# Patient Record
Sex: Female | Born: 1940 | Race: White | Hispanic: No | Marital: Married | Smoking: Never smoker
Health system: Southern US, Community
[De-identification: ages and names within clinical notes are randomized; demographics above are authoritative.]

## PROBLEM LIST (undated history)

## (undated) DIAGNOSIS — E785 Hyperlipidemia, unspecified: Secondary | ICD-10-CM

## (undated) DIAGNOSIS — I10 Essential (primary) hypertension: Secondary | ICD-10-CM

## (undated) DIAGNOSIS — R9430 Abnormal result of cardiovascular function study, unspecified: Secondary | ICD-10-CM

## (undated) DIAGNOSIS — T443X5A Adverse effect of other parasympatholytics [anticholinergics and antimuscarinics] and spasmolytics, initial encounter: Secondary | ICD-10-CM

## (undated) DIAGNOSIS — Z9049 Acquired absence of other specified parts of digestive tract: Secondary | ICD-10-CM

## (undated) DIAGNOSIS — R42 Dizziness and giddiness: Secondary | ICD-10-CM

## (undated) DIAGNOSIS — IMO0002 Reserved for concepts with insufficient information to code with codable children: Secondary | ICD-10-CM

## (undated) DIAGNOSIS — R6884 Jaw pain: Secondary | ICD-10-CM

## (undated) DIAGNOSIS — R002 Palpitations: Secondary | ICD-10-CM

## (undated) DIAGNOSIS — Z789 Other specified health status: Secondary | ICD-10-CM

## (undated) DIAGNOSIS — K219 Gastro-esophageal reflux disease without esophagitis: Secondary | ICD-10-CM

## (undated) HISTORY — PX: CHOLECYSTECTOMY: SHX55

## (undated) HISTORY — DX: Dizziness and giddiness: R42

## (undated) HISTORY — DX: Reserved for concepts with insufficient information to code with codable children: IMO0002

## (undated) HISTORY — DX: Gastro-esophageal reflux disease without esophagitis: K21.9

## (undated) HISTORY — DX: Essential (primary) hypertension: I10

## (undated) HISTORY — PX: CARDIAC CATHETERIZATION: SHX172

## (undated) HISTORY — DX: Abnormal result of cardiovascular function study, unspecified: R94.30

## (undated) HISTORY — PX: OTHER SURGICAL HISTORY: SHX169

## (undated) HISTORY — DX: Hyperlipidemia, unspecified: E78.5

## (undated) HISTORY — DX: Adverse effect of other parasympatholytics (anticholinergics and antimuscarinics) and spasmolytics, initial encounter: T44.3X5A

## (undated) HISTORY — DX: Palpitations: R00.2

## (undated) HISTORY — DX: Other specified health status: Z78.9

## (undated) HISTORY — DX: Acquired absence of other specified parts of digestive tract: Z90.49

## (undated) HISTORY — DX: Jaw pain: R68.84

## (undated) HISTORY — PX: BREAST BIOPSY: SHX20

---

## 2001-05-26 ENCOUNTER — Ambulatory Visit (HOSPITAL_COMMUNITY): Admission: RE | Admit: 2001-05-26 | Discharge: 2001-05-27 | Payer: Self-pay | Admitting: Cardiology

## 2001-05-26 ENCOUNTER — Encounter: Payer: Self-pay | Admitting: Cardiology

## 2001-05-27 ENCOUNTER — Encounter: Payer: Self-pay | Admitting: Cardiology

## 2001-06-14 ENCOUNTER — Encounter: Payer: Self-pay | Admitting: Cardiology

## 2002-01-14 ENCOUNTER — Encounter: Payer: Self-pay | Admitting: Cardiology

## 2002-11-01 ENCOUNTER — Encounter: Payer: Self-pay | Admitting: Cardiology

## 2002-11-13 ENCOUNTER — Encounter: Payer: Self-pay | Admitting: Cardiology

## 2005-04-07 ENCOUNTER — Ambulatory Visit: Payer: Self-pay | Admitting: Cardiology

## 2005-04-13 ENCOUNTER — Encounter: Payer: Self-pay | Admitting: Cardiology

## 2005-04-13 ENCOUNTER — Ambulatory Visit: Payer: Self-pay | Admitting: Cardiology

## 2005-05-01 ENCOUNTER — Ambulatory Visit: Payer: Self-pay | Admitting: Cardiology

## 2006-03-19 DIAGNOSIS — Z9049 Acquired absence of other specified parts of digestive tract: Secondary | ICD-10-CM

## 2006-03-19 HISTORY — DX: Acquired absence of other specified parts of digestive tract: Z90.49

## 2008-01-25 ENCOUNTER — Encounter: Payer: Self-pay | Admitting: Cardiology

## 2008-02-24 ENCOUNTER — Encounter: Payer: Self-pay | Admitting: Cardiology

## 2008-07-11 ENCOUNTER — Encounter: Payer: Self-pay | Admitting: Cardiology

## 2009-02-28 ENCOUNTER — Encounter: Payer: Self-pay | Admitting: Cardiology

## 2009-03-04 ENCOUNTER — Encounter: Payer: Self-pay | Admitting: Cardiology

## 2009-04-05 ENCOUNTER — Encounter: Payer: Self-pay | Admitting: Cardiology

## 2009-04-30 DIAGNOSIS — R5381 Other malaise: Secondary | ICD-10-CM

## 2009-04-30 DIAGNOSIS — K219 Gastro-esophageal reflux disease without esophagitis: Secondary | ICD-10-CM

## 2009-04-30 DIAGNOSIS — R5383 Other fatigue: Secondary | ICD-10-CM

## 2009-05-01 ENCOUNTER — Ambulatory Visit: Payer: Self-pay | Admitting: Cardiology

## 2009-05-01 ENCOUNTER — Encounter: Payer: Self-pay | Admitting: Cardiology

## 2010-03-18 NOTE — Cardiovascular Report (Signed)
Summary: Cardiac Cath Other  Cardiac Cath Other   Imported By: Zachary George 04/30/2009 18:56:27  _____________________________________________________________________  External Attachment:    Type:   Image     Comment:   External Document

## 2010-03-18 NOTE — Progress Notes (Signed)
Summary: Office Visit  Office Visit   Imported By: Zachary George 04/30/2009 18:57:32  _____________________________________________________________________  External Attachment:    Type:   Image     Comment:   External Document

## 2010-03-18 NOTE — Assessment & Plan Note (Signed)
Summary: 3 YR FU R/S MAILED LETTER TO NOTIFY   Visit Type:  Follow-up Primary Provider:  Sherryll Burger  CC:  hypertension.  History of Present Illness: The patient is seen for followup of hypertension and a history of chest pain in the past..  I saw the patient last in 2007.  She had some chest discomfort in the past.  Cardiac catheterization in 2003 showed no major epicardial disease.  Her distal vessels were small.  She had a significant elevation of her LDL.  Since that time it is my understanding that the statins have been tried and that she has significant muscle weakness.  She has hypertension.  She does not tolerate medicines during the day as they make her feel tired.  She had a renal artery ultrasound done March 04, 2009.  There was no evidence of hemodynamically significant renal artery stenosis.  She has not been having any significant chest.    Preventive Screening-Counseling & Management  Alcohol-Tobacco     Smoking Status: never  Current Medications (verified): 1)  Amlodipine Besylate 10 Mg Tabs (Amlodipine Besylate) .... Take 1/2 Tablet By Mouth Once A Day 2)  Nadolol 20 Mg Tabs (Nadolol) .... Take 1 Tablet By Mouth Once A Day 3)  Clonidine Hcl 0.1 Mg Tabs (Clonidine Hcl) .... Take 1/2 Tablet By Mouth Once A Day 4)  Maxzide-25 37.5-25 Mg Tabs (Triamterene-Hctz) .... Take 1/2 Tablet By Mouth Once A Day As Needed 5)  Alprazolam 0.5 Mg Tabs (Alprazolam) .... Take 1/4 Tablet By Mouth Once A Day As Needed 6)  Prilosec 20 Mg Cpdr (Omeprazole) .... Take 1 Tablet By Mouth Once A Day  Allergies (verified): 1)  ! * Ive Dye 2)  ! * Antibiotics  Comments:  Nurse/Medical Assistant: The patient's medications and allergies were reviewed with the patient and were updated in the Medication and Allergy Lists. List reveiwed.  Past History:  Past Medical History: chest pain...cardiac catherization with no major epicardial disease but some tapering of the small distal  vasculature LDL.... elevated by history.... statin intolerance... using red yeast rice in 2011 G E R D Hypertension Jaw pain, etiology uncertain Sensitivity to atropine EF  65%... echo... April, 2003 Cholecystectomy.. February, 2010  Social History: Smoking Status:  never  Review of Systems       Patient denies fever, chills, headache, sweats, rash, change in vision, change in hearing, chest pain, cough, shortness of breath, urinary symptoms.  She does have reflux symptoms.  All of the systems are reviewed and are negative.  Vital Signs:  Patient profile:   70 year old female Height:      59 inches Weight:      157 pounds BMI:     31.82 Pulse rate:   68 / minute BP sitting:   151 / 80  (left arm) Cuff size:   large  Vitals Entered By: Carlye Grippe (May 01, 2009 1:15 PM)  Nutrition Counseling: Patient's BMI is greater than 25 and therefore counseled on weight management options. CC: hypertension   Physical Exam  General:  patient is stable today and looks well.  She is overweight. Head:  head is atraumatic. Eyes:  no xanthelasma. Neck:  no jugular venous attention. Chest Wall:  no chest wall tenderness. Lungs:  lungs are clear.  Respiratory effort is nonlabored. Heart:  cardiac exam reveals S1-S2.  No clicks or significant murmurs. Abdomen:  abdomen is soft. Msk:  no musculoskeletal deformities. Extremities:  no peripheral edema. Skin:  no skin rashes.  Psych:  patient is oriented to person time and place.  Affect is normal.   Impression & Recommendations:  Problem # 1:  CHEST PAIN (ICD-786.50) Patient has not had any significant recurrent chest pain.  No further ischemic workup is needed at this time.  EKG is done and reviewed by me.  There is normal sinus rhythm and normal EKG.  Problem # 2:  PALPITATIONS (ICD-785.1) Patient has not been having any significant palpitations.  No further workup.  Problem # 3:  PURE HYPERCHOLESTEROLEMIA (ICD-272.0) the  patient's cholesterol is being treated the best way possible.  No further evaluation now.  Problem # 4:  HYPERTENSION, BENIGN (ICD-401.1) The patient tells me that she does not tolerate her blood pressure meds well and she takes them at night.  Her systolic pressure is only mildly elevated today.  This may be the best we can do.  Her last echo was done in 2003.  There is no EKG change and no significant symptoms.  I have consider followup echo but decided that there is no definite indication at this point.  I will see her for cardiology followup over time.  Other Orders: EKG w/ Interpretation (93000)  Patient Instructions: 1)  Your physician recommends that you continue on your current medications as directed. Please refer to the Current Medication list given to you today. 2)  Your physician wants you to follow-up in: 12 months. You will receive a reminder letter in the mail about two months in advance. If you don't receive a letter, please call our office to schedule the follow-up appointment.

## 2010-03-18 NOTE — Progress Notes (Signed)
Summary: Office Visit  Office Visit   Imported By: Zachary George 04/30/2009 18:57:11  _____________________________________________________________________  External Attachment:    Type:   Image     Comment:   External Document

## 2010-03-18 NOTE — Letter (Signed)
Summary: Appointment- Rescheduled  [facility] [address]   [place], [place] [address]   Phone: [phone]  Fax: [phone]     April 05, 2009 MRN: [id number]     Lindsay Petty [address] [place], [place]  [address]     Dear [name],   Due to a change in our office schedule, your appointment on  March 9,2011                    at  1:00 pm must be changed.    Your new appointment will be March 16th at 1:00pm.  We look forward to participating in your health care needs.   Please contact us at the number listed above at your earliest convenience to reschedule this appointment if needed.      Sincerely,  Glass blower/designer

## 2010-03-18 NOTE — Letter (Signed)
Summary: Discharge Summary  Discharge Summary   Imported By: Zachary George 04/30/2009 18:56:51  _____________________________________________________________________  External Attachment:    Type:   Image     Comment:   External Document

## 2010-07-03 ENCOUNTER — Encounter: Payer: Self-pay | Admitting: Cardiology

## 2010-07-04 ENCOUNTER — Encounter: Payer: Self-pay | Admitting: Cardiology

## 2010-07-04 DIAGNOSIS — I10 Essential (primary) hypertension: Secondary | ICD-10-CM | POA: Insufficient documentation

## 2010-07-04 DIAGNOSIS — Z789 Other specified health status: Secondary | ICD-10-CM | POA: Insufficient documentation

## 2010-07-04 DIAGNOSIS — E785 Hyperlipidemia, unspecified: Secondary | ICD-10-CM | POA: Insufficient documentation

## 2010-07-04 DIAGNOSIS — R9430 Abnormal result of cardiovascular function study, unspecified: Secondary | ICD-10-CM | POA: Insufficient documentation

## 2010-07-04 DIAGNOSIS — R079 Chest pain, unspecified: Secondary | ICD-10-CM | POA: Insufficient documentation

## 2010-07-04 DIAGNOSIS — K219 Gastro-esophageal reflux disease without esophagitis: Secondary | ICD-10-CM | POA: Insufficient documentation

## 2010-07-04 DIAGNOSIS — R002 Palpitations: Secondary | ICD-10-CM | POA: Insufficient documentation

## 2010-07-04 NOTE — Discharge Summary (Signed)
[place]  Patient:    Lindsay Petty, Lindsay Petty Visit Number: [id number] MRN: [id number]          Service Type: CAT Location: 6500 [id number] 01 Attending Physician:  Ronaldo Miyamoto Dictated by:   Pennelope Bracken, N.P. Admit Date:  05/26/2001 Disc. Date: 05/27/01   CC:         Luis Abed, M.D. [facility]  Dr. Suanne Marker, [place]   Discharge Summary  REASON FOR ADMISSION:  Exertional jaw discomfort.  DISCHARGE DIAGNOSES: 1. Exertional jaw pain, etiology uncertain. 2. Hypertension. 3. History of mitral valve prolapse by echocardiogram per patient, no    documentation of this. 4. Question of hyperlipidemia. 5. Gastroesophageal reflux disease. 6. Sensitivity to atropine.  HISTORY OF PRESENT ILLNESS:  This delightful, 70 year old female was seen in the [facility] on April 3, for evaluation of exertional jaw pain.  She had initially got this on climbing stairs, but it had progressed to where it was occurring with less strenuous exercise.  She denied any rest pain, diaphoresis or nausea.  With that, she was scheduled for an endoscopy with Dr. Cleotis Nipper for evaluation of reflux, but he was hesitant to perform this without thorough cardiac evaluation.  For this reason, she was admitted for angiography.  HOSPITAL COURSE:  The patient was admitted in stable condition.  Dr. Riley Kill performed angiography on April 10, which revealed normal LV function with an EF of 61%.  There was no significant obstructive disease seen.  Distal vessels were tapered.  The patient recovered uneventfully and was returned to the floor.  She did experience some postprocedure oozing at the groin site, but this was treated and resolved quickly.  The following day, Dr. Riley Kill decided the patient was appropriate for discharge.  DISCHARGE PHYSICAL EXAMINATION:  On the day of discharge, the patient offered no complaint of jaw pain, chest pain, dyspnea or palpitations.  VITAL  SIGNS:  Blood pressure 162/70, pulse 62, respiratory rate 20, temperature 98.  Telemetry revealed normal sinus rhythm.  GENERAL:  The patient was in no acute distress.  LUNGS:  Clear to auscultation bilaterally.  CARDIAC:  Regular rate and rhythm with S1, S2 clear.  There were no murmurs, rubs or gallops.  EXTREMITIES:  Groin site without ecchymosis, hematoma or bruit.  Without clubbing, cyanosis, or edema.  LABORATORY DATA AND X-RAY FINDINGS:  A 12-lead EKG obtained the morning of discharge showed normal sinus rhythm with T wave flattening in leads I, V5 and V6.  DISPOSITION:  Discharged to home.  DISCHARGE MEDICATIONS: 1. Corgard 20 mg one q.d. 2. Prevacid 30 mg one q.d. 3. Maxzide 1/2 tablet q.d. as needed. 4. Aspirin 325 mg one q.d.  It should be noted on the patients intake sheet,    she admits to taking 1000 mg of aspirin per day.  ACTIVITY:  No driving, heavy lifting, tub baths or sexual activity x2 days.  DIET:  Low fat, low cholesterol diet.  WOUND CARE:  The patient agrees to call the office if her groin becomes hard or painful.  SPECIAL INSTRUCTIONS:  No more than one aspirin per day.  FOLLOWUP:  Followup will be with Dr. Myrtis Ser in [place] on April 24, at 11:45 a.m. as scheduled.  The patient agrees to call in the interim with any problems, questions, concerns, change or increase in symptoms. Dictated by:   Pennelope Bracken, N.P. Attending Physician:  Ronaldo Miyamoto DD:  05/27/01 TD:  05/27/01 Job: [id number]

## 2010-07-04 NOTE — Cardiovascular Report (Signed)
[place]  Patient:    Lindsay Petty, Lindsay Petty Visit Number: [id number] MRN: [id number]          Service Type: CAT Location: [facility] 2857 01 Attending Physician:  Ronaldo Miyamoto Dictated by:   Arturo Morton Riley Kill, M.D. [facility] Proc. Date: 05/26/01 Admit Date:  05/26/2001   CC:         Luis Abed, M.D. [facility] A. Cleotis Nipper, M.D., [place], [place].  Dr. Darius Bump, [place], [place].   Cardiac Catheterization  INDICATIONS:  Ms. Qualley is a very pleasant 70 year old white female who is referred for cardiac catheterization.  She saw Dr. Jerral Bonito in the office in [place] and provided a history of exertion related jaw discomfort.  She was referred by Dr. Cleotis Nipper because of symptoms that sounded very compatible with angina.  Risks, benefits, and alternatives were discussed with the patient and her husband.  She was brought to the lab for further evaluation.  PROCEDURES: 1. Selective left ventriculography. 2. Selective coronary arteriography. 3. Left heart catheterization.  DESCRIPTION OF PROCEDURE:  The procedure was performed from the right femoral artery through an anterior puncture.  The #6 French catheters were utilized. She experienced no complications.  Her blood pressure was elevated and she was given two doses of intravenous metoprolol, 5 mg.  She was taken to the holding area in satisfactory clinical condition.  ANGIOGRAPHIC DATA: 1. Ventriculography was performed in the RAO projection.  Overall left    ventricular function was preserved and no segmental abnormalities,    contraction were identified.  Ejection fraction was calculated at 61%.  I    could not appreciate significant mitral regurgitation.  2. The left main coronary artery appeared free of critical disease.  3. The left anterior descending artery coursed to the apex.  There were four    small diagonal branches.  The LAD as well as the other terminal branches of    the coronary  artery all tapered rather quickly and were fairly small in    caliber.  4. There is a small ramus that was free of critical disease.  5. There was a circumflex that provided two marginals that appeared to be free    of significant disease.  Again, the vessels terminated distally as small    vessels.  6. The right coronary artery was a dominant vessel.  There was a small acute    marginal branch in terms of caliber.  There was a moderate sized    posterolateral branch.  All of these were without obvious focal narrowing.  CONCLUSIONS: 1. Preserved left ventricular function. 2. 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  DISPOSITION:  The patient will follow up with Dr. Jerral Bonito in the [place] office. Dictated by:   Arturo Morton Riley Kill, M.D. [facility] Attending Physician:  Ronaldo Miyamoto DD:  05/26/01 TD:  05/26/01 Job: [id number]

## 2010-07-07 ENCOUNTER — Encounter: Payer: Self-pay | Admitting: Cardiology

## 2010-07-07 ENCOUNTER — Ambulatory Visit (INDEPENDENT_AMBULATORY_CARE_PROVIDER_SITE_OTHER): Payer: Medicare Other | Admitting: Cardiology

## 2010-07-07 DIAGNOSIS — I10 Essential (primary) hypertension: Secondary | ICD-10-CM

## 2010-07-07 DIAGNOSIS — R079 Chest pain, unspecified: Secondary | ICD-10-CM

## 2010-07-07 DIAGNOSIS — R002 Palpitations: Secondary | ICD-10-CM

## 2010-07-07 DIAGNOSIS — Z888 Allergy status to other drugs, medicaments and biological substances status: Secondary | ICD-10-CM

## 2010-07-07 DIAGNOSIS — Z789 Other specified health status: Secondary | ICD-10-CM

## 2010-07-07 MED ORDER — AMLODIPINE BESYLATE 10 MG PO TABS: 10.0000 mg | ORAL_TABLET | Freq: Every day | ORAL | Status: DC

## 2010-07-07 NOTE — Assessment & Plan Note (Signed)
I feel that the patient's palpitations are not significant.  Further testing is not need.

## 2010-07-07 NOTE — Assessment & Plan Note (Signed)
Blood pressure is elevated today.  She has had excess salt intake.  I discussed this with her.  I will also increase her amlodipine to 10 mg daily.  I will then see her back for followup.

## 2010-07-07 NOTE — Assessment & Plan Note (Signed)
She has not had any significant chest pain.  No further workup is needed. 

## 2010-07-07 NOTE — Patient Instructions (Signed)
   Increase Amlodipine to 10mg  daily Your physician recommends that you go to the [facility] for lab work for [employer] level. If the results of your test are normal or stable, you will receive a letter.  If they are abnormal, the nurse will contact you by phone. Follow up in  6 weeks - see above for appointment.

## 2010-07-07 NOTE — Assessment & Plan Note (Signed)
In the past the patient's LDL has been elevated.  She had used red yeast rice in the past.  She is not currently on this.  Unfortunately she is intolerant to statins.

## 2010-07-07 NOTE — Progress Notes (Signed)
HPI Lindsay Petty is seen for followup of palpitations and hypertension.  I saw her last March, 2011.  In general she's done well.  She's noted some palpitations.  She feels better with a small dose of over-the-counter magnesium and potassium.  We will check her chemistry values to be sure that these are not excessive.  She has some mild positional vertigo.  In the in the past she had some jaw discomfort.  This has resolved.  As part of today's evaluation I have reviewed her old records and brought the current EMR up-to-date. Allergies  Allergen Reactions  . Band-Aid Plus Antibiotic (Bacitracin-Polymyxin B) Rash  . Latex Rash    Current Outpatient Prescriptions  Medication Sig Dispense Refill  . amLODipine (NORVASC) 5 MG tablet Take 5 mg by mouth daily.        . calcium carbonate (OS-CAL) 600 MG TABS Take 600 mg by mouth 2 (two) times daily with a meal.        . Cholecalciferol (VITAMIN D3) 1000 UNITS CAPS Take 1 capsule by mouth daily.        . Coenzyme Q10 (COQ10) 100 MG CAPS Take 1 capsule by mouth daily.        . Flaxseed, Linseed, (FLAXSEED OIL) 1000 MG CAPS Take 1 capsule by mouth 2 (two) times daily.        [name] glucosamine-chondroitin 500-400 MG tablet Take 1 tablet by mouth daily as needed.        . Mag Aspart-Potassium Aspart (POTASSIUM & MAGNESIUM ASPARTAT PO) Take 1 capsule by mouth daily.        . Multiple Vitamin (MULTIVITAMIN) tablet Take 1 tablet by mouth daily.        . nadolol (CORGARD) 20 MG tablet Take 20 mg by mouth daily.        [name] omeprazole (PRILOSEC) 20 MG capsule Take 20 mg by mouth daily.        [name] DISCONTD: ALPRAZolam (XANAX) 0.5 MG tablet Take 0.25 mg by mouth at bedtime as needed.        [name] DISCONTD: AMLODIPINE BESYLATE PO Take 10 mg by mouth daily.       [name] DISCONTD: CLONIDINE HCL PO Take 0.1 mg by mouth daily. Take 1/2 tab by mouth PRN.       . DISCONTD: triamterene-hydrochlorothiazide (MAXZIDE-25) 37.5-25 MG per tablet Take 1 tablet by mouth daily.          History    Social History  . Marital Status: Single    Spouse Name: N/A    Number of Children: N/A  . Years of Education: N/A   Occupational History  . retired    Social History Main Topics  . Smoking status: Never Smoker   . Smokeless tobacco: Not on file  . Alcohol Use: No  . Drug Use: No  . Sexually Active: Not on file   Other Topics Concern  . Not on file   Social History Narrative  . No narrative on file    Family History  Problem Relation Age of Onset  . Coronary artery disease      unknown    Past Medical History  Diagnosis Date  . Chest pain     cardiac catheterization 2003, no major epicardial disease, small distal vessels I  . GERD (gastroesophageal reflux disease)   . Hyperlipemia   . Hypertension     renal artery ultrasound, January, 2011, no significant renal artery stenosis,  ... medications make her feel tired  .  Jaw pain     etiology uncertain  . Ejection fraction     65%.Marland Kitchenecho April 2003  . Hx of cholecystectomy 03/2006    lap   . Statin intolerance     muscle weakness  . Atropine adverse reaction     atropine and sensitivity by history  . Palpitations     No past surgical history on file.  ROS  Lindsay Petty denies fever, chills, headache, sweats, rash, change in vision, change in hearing, chest pain, cough, nausea vomiting, urinary symptoms.  All other systems are reviewed and are negative.  PHYSICAL EXAM Lindsay Petty is oriented to person time and place.  Affect is normal.  Head is atraumatic.  There is no xanthelasma.  There is no jugular venous distention.  Lungs are clear.  Respiratory effort is nonlabored.  Cardiac exam reveals S1 and S2.  There are no clicks or significant murmurs.  The abdomen is soft.  There is no peripheral edema.  No musculoskeletal deformities.  There are no skin rashes. Filed Vitals:   07/07/10 0950 07/07/10 0952  BP: 175/88 171/76  Pulse: 70 69  Height: 5' (1.524 m)   Weight: 157 lb (71.215 kg)     EKG EKG is done today  and reviewed by me.  EKG is normal.  ASSESSMENT & PLAN

## 2010-07-29 ENCOUNTER — Encounter: Payer: Self-pay | Admitting: Cardiology

## 2010-08-19 ENCOUNTER — Encounter: Payer: Self-pay | Admitting: Cardiology

## 2010-08-19 ENCOUNTER — Ambulatory Visit (INDEPENDENT_AMBULATORY_CARE_PROVIDER_SITE_OTHER): Payer: Medicare Other | Admitting: Cardiology

## 2010-08-19 DIAGNOSIS — R42 Dizziness and giddiness: Secondary | ICD-10-CM

## 2010-08-19 DIAGNOSIS — I10 Essential (primary) hypertension: Secondary | ICD-10-CM

## 2010-08-19 DIAGNOSIS — R002 Palpitations: Secondary | ICD-10-CM

## 2010-08-19 DIAGNOSIS — R079 Chest pain, unspecified: Secondary | ICD-10-CM

## 2010-08-19 MED ORDER — HYDROCHLOROTHIAZIDE 12.5 MG PO CAPS: 12.5000 mg | ORAL_CAPSULE | Freq: Every day | ORAL | Status: DC

## 2010-08-19 MED ORDER — NADOLOL 20 MG PO TABS: 20.0000 mg | ORAL_TABLET | Freq: Every day | ORAL | Status: DC

## 2010-08-19 MED ORDER — AMLODIPINE BESYLATE 10 MG PO TABS: 10.0000 mg | ORAL_TABLET | Freq: Every day | ORAL | Status: DC

## 2010-08-19 NOTE — Progress Notes (Signed)
HPI   Patient is seen for followup of hypertension, chest pain, palpitations.  She is not having any significant palpitations or chest pain.  Her diastolic blood pressures controlled.  We did increase her amlodipine.  Despite this she still has some intermittent systolic hypertension.  When she uses a small dose of Xanax her blood pressure is better.  However she knows we cannot use his medications blood pressure medicine.  With the amlodipine she's had slight increase in edema.  This does not concern her and I explained that it is a common side effect. Allergies  Allergen Reactions  . Band-Aid Plus Antibiotic (Bacitracin-Polymyxin B) Rash  . Latex Rash    Current Outpatient Prescriptions  Medication Sig Dispense Refill  . amLODipine (NORVASC) 10 MG tablet Take 1 tablet (10 mg total) by mouth daily.  30 tablet  6  . calcium carbonate (OS-CAL) 600 MG TABS Take 600 mg by mouth 2 (two) times daily with a meal.        . Cholecalciferol (VITAMIN D3) 1000 UNITS CAPS Take 1 capsule by mouth daily.        . Coenzyme Q10 (COQ10) 100 MG CAPS Take 1 capsule by mouth daily.        . Flaxseed, Linseed, (FLAXSEED OIL) 1000 MG CAPS Take 1 capsule by mouth 2 (two) times daily.        [name] glucosamine-chondroitin 500-400 MG tablet Take 1 tablet by mouth daily as needed.        . Mag Aspart-Potassium Aspart (POTASSIUM & MAGNESIUM ASPARTAT PO) Take 1 capsule by mouth daily.        . Multiple Vitamin (MULTIVITAMIN) tablet Take 1 tablet by mouth daily.        . nadolol (CORGARD) 20 MG tablet Take 20 mg by mouth daily.        [name] omeprazole (PRILOSEC) 20 MG capsule Take 20 mg by mouth daily.          History   Social History  . Marital Status: Single    Spouse Name: N/A    Number of Children: N/A  . Years of Education: N/A   Occupational History  . retired    Social History Main Topics  . Smoking status: Never Smoker   . Smokeless tobacco: Never Used  . Alcohol Use: No  . Drug Use: No  . Sexually Active:  Not on file   Other Topics Concern  . Not on file   Social History Narrative   Retired, married.     Family History  Problem Relation Age of Onset  . Coronary artery disease      unknown    Past Medical History  Diagnosis Date  . Chest pain     cardiac catheterization 2003, no major epicardial disease, small distal vessels I  . GERD (gastroesophageal reflux disease)   . Hyperlipemia   . Hypertension     renal artery ultrasound, January, 2011, no significant renal artery stenosis,  ... medications make her feel tired  . Jaw pain     etiology uncertain  . Ejection fraction     65%.Marland Kitchenecho April 2003  . Hx of cholecystectomy 03/2006    lap   . Statin intolerance     muscle weakness  . Atropine adverse reaction     atropine and sensitivity by history  . Palpitations     Past Surgical History  Procedure Date  . Cardiac catheterization   . Breast biopsy     x2 for  benign disease  . Rt knee arthroscopy   . Cholecystectomy     (lap) 2008.    ROS  Patient denies fever, chills, headache, sweats, rash, change in vision, change in hearing, chest pain, cough, nausea vomiting, urinary symptoms.  All other systems are reviewed and negative.  PHYSICAL EXAM Patient is stable today.  She is oriented to person time and place.  Affect is normal.  Head is atraumatic.  Lungs are clear.  Respiratory effort is unlabored.  Cardiac exam feels muffled S2.  No clicks or significant murmurs.  He is soft.  There is no significant peripheral edema at this time. Filed Vitals:   08/19/10 0938  BP: 164/85  Pulse: 64  Height: 5' (1.524 m)  Weight: 158 lb (71.668 kg)    EKG Is not done today.  ASSESSMENT & PLAN

## 2010-08-19 NOTE — Assessment & Plan Note (Signed)
Very mild palpitations.  No change in therapy.

## 2010-08-19 NOTE — Assessment & Plan Note (Signed)
This is mild at times with turning her head.  His is not a formal orthostasis for her.

## 2010-08-19 NOTE — Assessment & Plan Note (Signed)
Blood pressure is still higher than I would like.  The next step will be to add a low dose of hydrochlorothiazide.  Hold and see her for followup.

## 2010-08-19 NOTE — Patient Instructions (Addendum)
Begin HCTZ 12.5mg  daily Follow up - see appointment above.

## 2010-08-19 NOTE — Assessment & Plan Note (Signed)
No significant chest pain.  No further workup.

## 2010-10-08 ENCOUNTER — Ambulatory Visit (INDEPENDENT_AMBULATORY_CARE_PROVIDER_SITE_OTHER): Payer: Medicare Other | Admitting: Cardiology

## 2010-10-08 ENCOUNTER — Encounter: Payer: Self-pay | Admitting: Cardiology

## 2010-10-08 DIAGNOSIS — R079 Chest pain, unspecified: Secondary | ICD-10-CM

## 2010-10-08 DIAGNOSIS — I10 Essential (primary) hypertension: Secondary | ICD-10-CM

## 2010-10-08 DIAGNOSIS — Z79899 Other long term (current) drug therapy: Secondary | ICD-10-CM

## 2010-10-08 NOTE — Assessment & Plan Note (Signed)
Blood pressure is under good control.  No change in therapy.  She's had a small dose of a diuretic added.  She will need chemistry checked to be sure that her potassium is stable.

## 2010-10-08 NOTE — Assessment & Plan Note (Signed)
She's had no recurrent chest pain. No further workup. 

## 2010-10-08 NOTE — Patient Instructions (Signed)
Your physician wants you to follow-up in: 6 months. You will receive a reminder letter in the mail one-two months in advance. If you don't receive a letter, please call our office to schedule the follow-up appointment. Your physician recommends that you continue on your current medications as directed. Please refer to the Current Medication list given to you today. Your physician recommends that you go to the [facility] for lab work: BMET If the results of your test are normal or stable, you will receive a letter. If they are abnormal, the nurse will contact you by phone.

## 2010-10-08 NOTE — Progress Notes (Signed)
HPI Patient is seen for followup of hypertension and fluid overload.  We had a small dose of hydrochlorothiazide at the time of her last visit.  She has done very well with this.  Her edema is very minimal.  She is not having any significant dizziness.  She's not having any chest pain. Allergies  Allergen Reactions  . Band-Aid Plus Antibiotic (Bacitracin-Polymyxin B) Rash  . Latex Rash    Current Outpatient Prescriptions  Medication Sig Dispense Refill  . amLODipine (NORVASC) 10 MG tablet Take 1 tablet (10 mg total) by mouth daily.  100 tablet  3  . calcium carbonate (OS-CAL) 600 MG TABS Take 600 mg by mouth 2 (two) times daily with a meal.        . Cholecalciferol (VITAMIN D3) 1000 UNITS CAPS Take 1 capsule by mouth daily.        . Coenzyme Q10 (COQ10) 100 MG CAPS Take 1 capsule by mouth daily.        . Flaxseed, Linseed, (FLAXSEED OIL) 1000 MG CAPS Take 1 capsule by mouth 2 (two) times daily.        [name] glucosamine-chondroitin 500-400 MG tablet Take 1 tablet by mouth daily as needed.        . hydrochlorothiazide (,MICROZIDE/HYDRODIURIL,) 12.5 MG capsule Take 1 capsule (12.5 mg total) by mouth daily.  30 capsule  6  . Mag Aspart-Potassium Aspart (POTASSIUM & MAGNESIUM ASPARTAT PO) Take 1 capsule by mouth daily.        . Multiple Vitamin (MULTIVITAMIN) tablet Take 1 tablet by mouth daily.        . nadolol (CORGARD) 20 MG tablet Take 1 tablet (20 mg total) by mouth daily.  90 tablet  3  . omeprazole (PRILOSEC) 20 MG capsule Take 20 mg by mouth daily.          History   Social History  . Marital Status: Single    Spouse Name: N/A    Number of Children: N/A  . Years of Education: N/A   Occupational History  . retired    Social History Main Topics  . Smoking status: Never Smoker   . Smokeless tobacco: Never Used  . Alcohol Use: No  . Drug Use: No  . Sexually Active: Not on file   Other Topics Concern  . Not on file   Social History Narrative   Retired, married.     Family  History  Problem Relation Age of Onset  . Coronary artery disease      unknown    Past Medical History  Diagnosis Date  . Chest pain     cardiac catheterization 2003, no major epicardial disease, small distal vessels I  . GERD (gastroesophageal reflux disease)   . Hyperlipemia   . Hypertension     renal artery ultrasound, January, 2011, no significant renal artery stenosis,  ... medications make her feel tired  . Jaw pain     etiology uncertain  . Ejection fraction     65%.Marland Kitchenecho April 2003  . Hx of cholecystectomy 03/2006    lap   . Statin intolerance     muscle weakness  . Atropine adverse reaction     atropine and sensitivity by history  . Palpitations   . Vertigo     Mild positional vertigo    Past Surgical History  Procedure Date  . Cardiac catheterization   . Breast biopsy     x2 for benign disease  . Rt knee arthroscopy   .  Cholecystectomy     (lap) 2008.    ROS  Patient denies fever, chills, headache, sweats, rash, change in vision, change in hearing, chest pain cough, nausea vomiting, urinary symptoms.  All other systems are reviewed and are negative.  PHYSICAL EXAM Patient looks quite good today.  She is oriented to person time and place.  Affect is normal.  Head is atraumatic.  Lungs are clear.  Respiratory effort is unlabored.  Cardiac exam reveals S1 and S2.  No clicks or significant murmurs.  Abdomen is soft.  No peripheral edema. Filed Vitals:   10/08/10 1107  BP: 150/82  Pulse: 65  Height: 5\' 3"  (1.6 m)  Weight: 158 lb (71.668 kg)    EKG is not done today.  ASSESSMENT & PLAN

## 2011-04-07 ENCOUNTER — Other Ambulatory Visit: Payer: Self-pay | Admitting: Cardiology

## 2011-08-04 ENCOUNTER — Other Ambulatory Visit: Payer: Self-pay | Admitting: Cardiology

## 2011-10-13 ENCOUNTER — Other Ambulatory Visit: Payer: Self-pay | Admitting: Cardiology

## 2011-11-17 ENCOUNTER — Other Ambulatory Visit: Payer: Self-pay | Admitting: Cardiology

## 2011-11-26 ENCOUNTER — Other Ambulatory Visit: Payer: Self-pay | Admitting: Cardiology

## 2011-12-07 ENCOUNTER — Ambulatory Visit: Payer: Medicare Other | Admitting: Cardiology

## 2011-12-11 ENCOUNTER — Other Ambulatory Visit: Payer: Self-pay | Admitting: Cardiology

## 2011-12-22 ENCOUNTER — Other Ambulatory Visit: Payer: Self-pay | Admitting: Cardiology

## 2011-12-23 ENCOUNTER — Other Ambulatory Visit: Payer: Self-pay | Admitting: Cardiology

## 2012-01-01 ENCOUNTER — Encounter: Payer: Self-pay | Admitting: Cardiology

## 2012-01-01 ENCOUNTER — Ambulatory Visit (INDEPENDENT_AMBULATORY_CARE_PROVIDER_SITE_OTHER): Payer: Medicare Other | Admitting: Cardiology

## 2012-01-01 VITALS — BP 148/79 | HR 69 | Ht 60.0 in | Wt 157.0 lb

## 2012-01-01 DIAGNOSIS — R079 Chest pain, unspecified: Secondary | ICD-10-CM

## 2012-01-01 DIAGNOSIS — I10 Essential (primary) hypertension: Secondary | ICD-10-CM

## 2012-01-01 DIAGNOSIS — R002 Palpitations: Secondary | ICD-10-CM

## 2012-01-01 MED ORDER — AMLODIPINE BESYLATE 10 MG PO TABS: 10.0000 mg | ORAL_TABLET | Freq: Every day | ORAL | Status: DC

## 2012-01-01 MED ORDER — NADOLOL 40 MG PO TABS: 20.0000 mg | ORAL_TABLET | Freq: Every day | ORAL | Status: DC

## 2012-01-01 MED ORDER — HYDROCHLOROTHIAZIDE 12.5 MG PO CAPS: 12.5000 mg | ORAL_CAPSULE | Freq: Every day | ORAL | Status: DC

## 2012-01-01 NOTE — Patient Instructions (Addendum)
Your physician recommends that you schedule a follow-up appointment in: 1 year. You will receive a reminder letter in the mail in about 10 months reminding you to call and schedule your appointment. If you don't receive this letter, please contact our office.  Your physician has recommended you make the following change in your medication: Please take amlodipine 10 mg instead of 5 mg as listed on your list today. All other medications will remain the same.

## 2012-01-01 NOTE — Assessment & Plan Note (Signed)
The patient had been on 10 mg of Norvasc with good control. She cut this back when she was standing a great deal and had some swelling. She is now able to get her feet elevated more the time. Her systolic pressure is mildly elevated on 5 mg. She has agreed to return to 10 mg and see how she does. If she has significant edema we should add a different medication.

## 2012-01-01 NOTE — Assessment & Plan Note (Signed)
The patient is not having any significant chest pain. No further workup.

## 2012-01-01 NOTE — Progress Notes (Signed)
Patient ID: Lindsay Petty, female   DOB: 1940-07-24, 71 y.o.   MRN: [id number]   HPI The patient is seen today to followup hypertension and fluid overload. I saw her last August, 2012. She really looks good. Over the past several months she has been very active and on her feet a great deal arranging for a wedding and remodeling her house. During this period she noticed some increased swelling in her feet. She felt this was from her Norvasc. She reduced the dose to 5 mg daily. She's not having any chest pain or dizziness  Allergies  Allergen Reactions  . Band-Aid Plus Antibiotic (Bacitracin-Polymyxin B) Rash  . Latex Rash    Current Outpatient Prescriptions  Medication Sig Dispense Refill  . amLODipine (NORVASC) 10 MG tablet TAKE 1 TABLET BY MOUTH DAILY  30 tablet  6  . calcium carbonate (OS-CAL) 600 MG TABS Take 600 mg by mouth daily.       . Cholecalciferol (VITAMIN D3) 1000 UNITS CAPS Take 1 capsule by mouth daily.        . Coenzyme Q10 (COQ10) 100 MG CAPS Take 1 capsule by mouth daily.        . Flaxseed, Linseed, (FLAXSEED OIL) 1000 MG CAPS Take 1 capsule by mouth daily.       [name] glucosamine-chondroitin 500-400 MG tablet Take 1 tablet by mouth daily as needed.        . hydrochlorothiazide (MICROZIDE) 12.5 MG capsule TAKE ONE CAPSULE BY MOUTH EVERY DAY  25 capsule  0  . Mag Aspart-Potassium Aspart (POTASSIUM & MAGNESIUM ASPARTAT PO) Take 1 capsule by mouth daily.        . Multiple Vitamin (MULTIVITAMIN) tablet Take 1 tablet by mouth daily.        . nadolol (CORGARD) 40 MG tablet TAKE ONE-HALF TABLET BY MOUTH EVERY DAY  15 tablet  0  . omeprazole (PRILOSEC) 20 MG capsule Take 20 mg by mouth daily.        . [DISCONTINUED] nadolol (CORGARD) 20 MG tablet Take 1 tablet (20 mg total) by mouth daily.  90 tablet  3    History   Social History  . Marital Status: Single    Spouse Name: N/A    Number of Children: N/A  . Years of Education: N/A   Occupational History  . retired     Social History Main Topics  . Smoking status: Never Smoker   . Smokeless tobacco: Never Used  . Alcohol Use: No  . Drug Use: No  . Sexually Active: Not on file   Other Topics Concern  . Not on file   Social History Narrative   Retired, married.     Family History  Problem Relation Age of Onset  . Coronary artery disease      unknown    Past Medical History  Diagnosis Date  . Chest pain     cardiac catheterization 2003, no major epicardial disease, small distal vessels I  . GERD (gastroesophageal reflux disease)   . Hyperlipemia   . Hypertension     renal artery ultrasound, January, 2011, no significant renal artery stenosis,  ... medications make her feel tired  . Jaw pain     etiology uncertain  . Ejection fraction     65%.Marland Kitchenecho April 2003  . Hx of cholecystectomy 03/2006    lap   . Statin intolerance     muscle weakness  . Atropine adverse reaction     atropine and sensitivity by  history  . Palpitations   . Vertigo     Mild positional vertigo    Past Surgical History  Procedure Date  . Cardiac catheterization   . Breast biopsy     x2 for benign disease  . Rt knee arthroscopy   . Cholecystectomy     (lap) 2008.    Patient Active Problem List  Diagnosis  . ESOPHAGEAL REFLUX  . OTHER MALAISE AND FATIGUE  . Chest pain  . GERD (gastroesophageal reflux disease)  . Hyperlipemia  . Hypertension  . Ejection fraction  . Statin intolerance  . Palpitations  . Vertigo    ROS   Patient denies fever, chills, headache, sweats, rash, change in vision, change in hearing, chest pain, cough, nausea vomiting, urinary symptoms. All the systems are reviewed and are negative.  PHYSICAL EXAM  Patient is oriented to person time and place. Affect is normal. Lungs are clear. Respiratory effort is nonlabored. There is no jugular venous distention. Cardiac exam reveals S1 and S2. There no clicks or significant murmurs. The abdomen is soft. There is no peripheral  edema.  Filed Vitals:   01/01/12 1056  BP: 148/79  Pulse: 69  Height: 5' (1.524 m)  Weight: 157 lb (71.215 kg)   EKG is done today and reviewed by me. The EKG is normal. There is slight decreased anterior R wave progression. There is no significant change.  ASSESSMENT & PLAN

## 2012-01-01 NOTE — Assessment & Plan Note (Signed)
She's not having any significant palpitations. See her back in one year.

## 2013-01-02 ENCOUNTER — Encounter: Payer: Self-pay | Admitting: Cardiology

## 2013-01-02 ENCOUNTER — Ambulatory Visit (INDEPENDENT_AMBULATORY_CARE_PROVIDER_SITE_OTHER): Payer: Medicare Other | Admitting: Cardiology

## 2013-01-02 VITALS — BP 167/88 | HR 68 | Ht 60.0 in | Wt 163.1 lb

## 2013-01-02 DIAGNOSIS — I10 Essential (primary) hypertension: Secondary | ICD-10-CM

## 2013-01-02 DIAGNOSIS — R079 Chest pain, unspecified: Secondary | ICD-10-CM

## 2013-01-02 DIAGNOSIS — E785 Hyperlipidemia, unspecified: Secondary | ICD-10-CM

## 2013-01-02 DIAGNOSIS — R002 Palpitations: Secondary | ICD-10-CM

## 2013-01-02 MED ORDER — NADOLOL 40 MG PO TABS: 20.0000 mg | ORAL_TABLET | Freq: Every day | ORAL | Status: DC

## 2013-01-02 MED ORDER — AMLODIPINE BESYLATE 10 MG PO TABS: 10.0000 mg | ORAL_TABLET | Freq: Every day | ORAL | Status: DC

## 2013-01-02 MED ORDER — HYDROCHLOROTHIAZIDE 12.5 MG PO CAPS: 12.5000 mg | ORAL_CAPSULE | Freq: Every day | ORAL | Status: DC

## 2013-01-02 NOTE — Progress Notes (Signed)
HPI Patient is seen back today to followup a history of mild chest discomfort, also some hypertension, also palpitations. I saw her last November, 2013. She has been stable since then. She has mild palpitations. She does not have syncope or presyncope. I was able to get her back on a higher dose of amlodipine. She is taking 10 mg daily and not having significant swelling. She has not checked her blood pressure over time as she loaned her blood pressure monitor to her son. She's not having any significant chest pain.  Allergies  Allergen Reactions  . Bee Venom Anaphylaxis  . Atropine     Heart race   . Band-Aid Plus Antibiotic [Bacitracin-Polymyxin B] Rash  . Latex Rash    Current Outpatient Prescriptions  Medication Sig Dispense Refill  . amLODipine (NORVASC) 10 MG tablet Take 1 tablet (10 mg total) by mouth daily.  30 tablet  6  . aspirin 81 MG EC tablet Take 81 mg by mouth daily. Swallow whole.      . calcium carbonate (OS-CAL) 600 MG TABS Take 600 mg by mouth daily.       . Cholecalciferol (VITAMIN D3) 1000 UNITS CAPS Take 1 capsule by mouth daily.        . Coenzyme Q10 (COQ10) 100 MG CAPS Take 1 capsule by mouth daily.        [name] esomeprazole (NEXIUM) 20 MG capsule Take 20 mg by mouth daily at 12 noon.      . Flaxseed, Linseed, (FLAXSEED OIL) 1000 MG CAPS Take 1 capsule by mouth daily.       [name] glucosamine-chondroitin 500-400 MG tablet Take 1 tablet by mouth daily as needed.        . hydrochlorothiazide (MICROZIDE) 12.5 MG capsule Take 1 capsule (12.5 mg total) by mouth daily.  90 capsule  3  . Mag Aspart-Potassium Aspart (POTASSIUM & MAGNESIUM ASPARTAT PO) Take 1 capsule by mouth daily.        . Multiple Vitamin (MULTIVITAMIN) tablet Take 1 tablet by mouth daily.        . nadolol (CORGARD) 40 MG tablet Take 0.5 tablets (20 mg total) by mouth daily.  45 tablet  3   No current facility-administered medications for this visit.    History   Social History  . Marital Status: Single     Spouse Name: N/A    Number of Children: N/A  . Years of Education: N/A   Occupational History  . retired    Social History Main Topics  . Smoking status: Never Smoker   . Smokeless tobacco: Never Used  . Alcohol Use: No  . Drug Use: No  . Sexual Activity: Not on file   Other Topics Concern  . Not on file   Social History Narrative   Retired, married.     Family History  Problem Relation Age of Onset  . Coronary artery disease      unknown    Past Medical History  Diagnosis Date  . Chest pain     cardiac catheterization 2003, no major epicardial disease, small distal vessels I  . GERD (gastroesophageal reflux disease)   . Hyperlipemia   . Hypertension     renal artery ultrasound, January, 2011, no significant renal artery stenosis,  ... medications make her feel tired  . Jaw pain     etiology uncertain  . Ejection fraction     65%.Marland Kitchenecho April 2003  . Hx of cholecystectomy 03/2006    lap   .  Statin intolerance     muscle weakness  . Atropine adverse reaction     atropine and sensitivity by history  . Palpitations   . Vertigo     Mild positional vertigo    Past Surgical History  Procedure Laterality Date  . Cardiac catheterization    . Breast biopsy      x2 for benign disease  . Rt knee arthroscopy    . Cholecystectomy      (lap) 2008.    Patient Active Problem List   Diagnosis Date Noted  . Vertigo   . Chest pain   . GERD (gastroesophageal reflux disease)   . Hyperlipemia   . Hypertension   . Ejection fraction   . Statin intolerance   . Palpitations   . ESOPHAGEAL REFLUX 04/30/2009  . OTHER MALAISE AND FATIGUE 04/30/2009    ROS  Patient denies fever, chills, headache, sweats, rash, change in vision, change in hearing, chest pain, cough, nausea vomiting, urinary symptoms. All other systems are reviewed and are negative.  PHYSICAL EXAM Patient is overweight. She is oriented to person time and place. Affect is normal. There is no  jugulovenous distention. Lungs are clear. Respiratory effort is nonlabored. Cardiac exam reveals S1 and S2. There no clicks or significant murmurs. Abdomen is soft. There is no peripheral edema. There are no musculoskeletal deformities. There are no skin rashes.  Filed Vitals:   01/02/13 1009  BP: 167/88  Pulse: 68  Height: 5' (1.524 m)  Weight: 163 lb 1.9 oz (73.991 kg)  SpO2: 97%   EKG is done today and reviewed by me. There is sinus rhythm. There is no significant change from the past.  ASSESSMENT & PLAN

## 2013-01-02 NOTE — Assessment & Plan Note (Signed)
Patient's systolic blood pressure is elevated today. I've chosen not to change her medicine today. We know from the past that she does not have renal artery stenosis. She will take her blood pressure to home on several occasions and call those values to [place]. We will then decide if further treatment is needed.

## 2013-01-02 NOTE — Assessment & Plan Note (Signed)
Historically she does not have significant arrhythmias. No further workup.

## 2013-01-02 NOTE — Assessment & Plan Note (Signed)
Patient is not having any significant pain. Catheterization in 2003 revealed no major epicardial disease. She did have small distal vessels.

## 2013-01-02 NOTE — Assessment & Plan Note (Signed)
Patient has had statin intolerance. No change in therapy.

## 2013-01-02 NOTE — Patient Instructions (Signed)
Your physician recommends that you schedule a follow-up appointment in: 1 year. You will receive a reminder letter in the mail in about 10 months reminding you to call and schedule your appointment. If you don't receive this letter, please contact our office. Your physician recommends that you continue on your current medications as directed. Please refer to the Current Medication list given to you today. Your physician has requested that you regularly monitor and record your blood pressure readings at home. Please use the same machine at the same time of day to check your readings and record them. Please call results to [place] or bring them to the office.

## 2013-02-01 ENCOUNTER — Encounter: Payer: Self-pay | Admitting: Cardiology

## 2013-09-18 ENCOUNTER — Encounter (INDEPENDENT_AMBULATORY_CARE_PROVIDER_SITE_OTHER): Payer: Self-pay | Admitting: *Deleted

## 2013-10-17 ENCOUNTER — Telehealth (INDEPENDENT_AMBULATORY_CARE_PROVIDER_SITE_OTHER): Payer: Self-pay | Admitting: *Deleted

## 2013-10-17 ENCOUNTER — Encounter (INDEPENDENT_AMBULATORY_CARE_PROVIDER_SITE_OTHER): Payer: Self-pay | Admitting: Internal Medicine

## 2013-10-17 ENCOUNTER — Other Ambulatory Visit (INDEPENDENT_AMBULATORY_CARE_PROVIDER_SITE_OTHER): Payer: Self-pay | Admitting: *Deleted

## 2013-10-17 ENCOUNTER — Ambulatory Visit (INDEPENDENT_AMBULATORY_CARE_PROVIDER_SITE_OTHER): Payer: Medicare Other | Admitting: Internal Medicine

## 2013-10-17 VITALS — BP 124/62 | HR 65 | Temp 97.8°F | Ht 60.0 in | Wt 159.5 lb

## 2013-10-17 DIAGNOSIS — R195 Other fecal abnormalities: Secondary | ICD-10-CM | POA: Insufficient documentation

## 2013-10-17 DIAGNOSIS — Z860100 Personal history of colon polyps, unspecified: Secondary | ICD-10-CM | POA: Insufficient documentation

## 2013-10-17 DIAGNOSIS — Z1211 Encounter for screening for malignant neoplasm of colon: Secondary | ICD-10-CM

## 2013-10-17 DIAGNOSIS — Z800 Family history of malignant neoplasm of digestive organs: Secondary | ICD-10-CM | POA: Insufficient documentation

## 2013-10-17 MED ORDER — PEG-KCL-NACL-NASULF-NA ASC-C 100 G PO SOLR: 1.0000 | Freq: Once | ORAL | Status: DC

## 2013-10-17 NOTE — Progress Notes (Addendum)
Subjective:    Patient ID: Lindsay Petty, female    DOB: 08-26-1940, 73 y.o.   MRN: [id number]  HPIReferred to our office by Dr. Manuella Ghazi for positive stool card. In his office for a routine physical.  She was taking ASA 81 mg at that time. She says she says blood occasionally when she wipes. She says she has external and internal hemorrhoids. Her last colonoscopy with snare polypectomy  was in 2007 by  Dr. Lindalou Hose.  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 There was no inflammation. 6 mm pedunculated polyp in the sigmoid colon. Normal colon, otherwise. Biopsy: tubular adenoma.  Hx of colon polyps.  Appetite is good. Acid reflux is controlled with Nexium. No dysphagia.  Denies abdominal pain.  Usually has a BM 1-2 times a day.  Has been off ASA since July.   Occasionally takes Motrin.   07/28/2013 H and H 12.0 and 36.9, MCV 94, platelet ct 265.  Recently had skin cancer removed from nose and left hand.   Review of Systems Past Medical History  Diagnosis Date  . Chest pain     cardiac catheterization 2003, no major epicardial disease, small distal vessels I  . GERD (gastroesophageal reflux disease)   . Hyperlipemia   . Hypertension     renal artery ultrasound, January, 2011, no significant renal artery stenosis,  ... medications make her feel tired  . Jaw pain     etiology uncertain  . Ejection fraction     65%.Marland Kitchenecho April 2003  . Hx of cholecystectomy 03/2006    lap   . Statin intolerance     muscle weakness  . Atropine adverse reaction     atropine and sensitivity by history  . Palpitations   . Vertigo     Mild positional vertigo    Past Surgical History  Procedure Laterality Date  . Cardiac catheterization    . Breast biopsy      x2 for benign disease  . Rt knee arthroscopy    . Cholecystectomy      (lap) 2008.    Allergies  Allergen Reactions  . Bee Venom Anaphylaxis  . Atropine     Heart race   .  Band-Aid Plus Antibiotic [Bacitracin-Polymyxin B] Rash  . Latex Rash    Current Outpatient Prescriptions on File Prior to Visit  Medication Sig Dispense Refill  . amLODipine (NORVASC) 10 MG tablet Take 1 tablet (10 mg total) by mouth daily.  90 tablet  3  . Cholecalciferol (VITAMIN D3) 1000 UNITS CAPS Take 2 capsules by mouth daily.       . Coenzyme Q10 (COQ10) 100 MG CAPS Take 1 capsule by mouth daily.        [name] esomeprazole (NEXIUM) 20 MG capsule Take 20 mg by mouth daily at 12 noon.      . Flaxseed, Linseed, (FLAXSEED OIL) 1000 MG CAPS Take 1 capsule by mouth daily.       . hydrochlorothiazide (MICROZIDE) 12.5 MG capsule Take 1 capsule (12.5 mg total) by mouth daily.  90 capsule  3  . Mag Aspart-Potassium Aspart (POTASSIUM & MAGNESIUM ASPARTAT PO) Take 1 capsule by mouth daily.       . Multiple Vitamin (MULTIVITAMIN) tablet Take 1 tablet by mouth daily.        . nadolol (CORGARD) 40 MG tablet Take 0.5 tablets (20 mg total) by mouth daily.  45 tablet  3  . glucosamine-chondroitin 500-400 MG tablet Take 1 tablet  by mouth daily as needed.         No current facility-administered medications on file prior to visit.        Objective:   Physical Exam  Filed Vitals:   10/17/13 1403  BP: 124/62  Pulse: 65  Temp: 97.8 F (36.6 C)  Height: 5' (1.524 m)  Weight: 159 lb 8 oz (72.349 kg)   Alert and oriented. Skin warm and dry. Oral mucosa is moist.   . Sclera anicteric, conjunctivae is pink. Thyroid not enlarged. No cervical lymphadenopathy. Lungs clear. Heart regular rate and rhythm.  Abdomen is soft. Bowel sounds are positive. No hepatomegaly. No abdominal masses felt. No tenderness.  No edema to lower extremities.          Assessment & Plan:  Guaiac positive stool, Family hx of colon cancer in a brother in his 83. Personal hx of colon polyps. Needs surveillance colonoscopy.

## 2013-10-17 NOTE — Telephone Encounter (Signed)
Patient needs movi prep 

## 2013-10-17 NOTE — Patient Instructions (Signed)
Colonoscopy.  The risks and benefits such as perforation, bleeding, and infection were reviewed with the patient and is agreeable. 

## 2013-11-14 ENCOUNTER — Encounter (HOSPITAL_COMMUNITY): Payer: Self-pay | Admitting: Pharmacy Technician

## 2013-11-22 ENCOUNTER — Ambulatory Visit (HOSPITAL_COMMUNITY)
Admission: RE | Admit: 2013-11-22 | Discharge: 2013-11-22 | Disposition: A | Discharge status: Home / Self Care | Payer: Medicare Other | Source: Ambulatory Visit | Attending: Internal Medicine | Admitting: Internal Medicine

## 2013-11-22 ENCOUNTER — Encounter (HOSPITAL_COMMUNITY): Payer: Self-pay | Admitting: *Deleted

## 2013-11-22 ENCOUNTER — Encounter (HOSPITAL_COMMUNITY)
Admission: RE | Disposition: A | Discharge status: Home / Self Care | Payer: Self-pay | Source: Ambulatory Visit | Attending: Internal Medicine

## 2013-11-22 DIAGNOSIS — K921 Melena: Secondary | ICD-10-CM | POA: Diagnosis not present

## 2013-11-22 DIAGNOSIS — K644 Residual hemorrhoidal skin tags: Secondary | ICD-10-CM | POA: Diagnosis not present

## 2013-11-22 DIAGNOSIS — Z86010 Personal history of colonic polyps: Secondary | ICD-10-CM

## 2013-11-22 DIAGNOSIS — D125 Benign neoplasm of sigmoid colon: Secondary | ICD-10-CM | POA: Insufficient documentation

## 2013-11-22 DIAGNOSIS — Z91040 Latex allergy status: Secondary | ICD-10-CM | POA: Insufficient documentation

## 2013-11-22 DIAGNOSIS — I10 Essential (primary) hypertension: Secondary | ICD-10-CM | POA: Diagnosis not present

## 2013-11-22 DIAGNOSIS — R195 Other fecal abnormalities: Secondary | ICD-10-CM

## 2013-11-22 DIAGNOSIS — Z79899 Other long term (current) drug therapy: Secondary | ICD-10-CM | POA: Insufficient documentation

## 2013-11-22 DIAGNOSIS — D123 Benign neoplasm of transverse colon: Secondary | ICD-10-CM

## 2013-11-22 DIAGNOSIS — K219 Gastro-esophageal reflux disease without esophagitis: Secondary | ICD-10-CM | POA: Diagnosis not present

## 2013-11-22 DIAGNOSIS — R6884 Jaw pain: Secondary | ICD-10-CM | POA: Diagnosis not present

## 2013-11-22 DIAGNOSIS — Z91030 Bee allergy status: Secondary | ICD-10-CM | POA: Diagnosis not present

## 2013-11-22 DIAGNOSIS — H8110 Benign paroxysmal vertigo, unspecified ear: Secondary | ICD-10-CM | POA: Diagnosis not present

## 2013-11-22 DIAGNOSIS — E785 Hyperlipidemia, unspecified: Secondary | ICD-10-CM | POA: Diagnosis not present

## 2013-11-22 DIAGNOSIS — Z888 Allergy status to other drugs, medicaments and biological substances status: Secondary | ICD-10-CM | POA: Insufficient documentation

## 2013-11-22 DIAGNOSIS — Z800 Family history of malignant neoplasm of digestive organs: Secondary | ICD-10-CM | POA: Diagnosis not present

## 2013-11-22 HISTORY — PX: COLONOSCOPY: SHX5424

## 2013-11-22 SURGERY — COLONOSCOPY
Anesthesia: Moderate Sedation

## 2013-11-22 MED ORDER — STERILE WATER FOR IRRIGATION IR SOLN
Status: DC | PRN
  Administered 2013-11-22: 14:00:00

## 2013-11-22 MED ORDER — MEPERIDINE HCL 50 MG/ML IJ SOLN
INTRAMUSCULAR | Status: AC
  Filled 2013-11-22: qty 1

## 2013-11-22 MED ORDER — MEPERIDINE HCL 50 MG/ML IJ SOLN
INTRAMUSCULAR | Status: DC | PRN
  Administered 2013-11-22 (×2): 25 mg via INTRAVENOUS

## 2013-11-22 MED ORDER — MIDAZOLAM HCL 5 MG/5ML IJ SOLN
INTRAMUSCULAR | Status: DC | PRN
  Administered 2013-11-22: 2 mg via INTRAVENOUS
  Administered 2013-11-22: 1 mg via INTRAVENOUS
  Administered 2013-11-22: 2 mg via INTRAVENOUS

## 2013-11-22 MED ORDER — MIDAZOLAM HCL 5 MG/5ML IJ SOLN
INTRAMUSCULAR | Status: AC
  Filled 2013-11-22: qty 10

## 2013-11-22 MED ORDER — SODIUM CHLORIDE 0.9 % IV SOLN
INTRAVENOUS | Status: DC
  Administered 2013-11-22: 1000 mL via INTRAVENOUS

## 2013-11-22 NOTE — Discharge Instructions (Signed)
No aspirin or NSAIDs for 1 week. °Resume usual medications and diet. °No driving for 24 hours. °Physician will call with biopsy results. ° °Colonoscopy, Care After °Refer to this sheet in the next few weeks. These instructions provide you with information on caring for yourself after your procedure. Your health care provider may also give you more specific instructions. Your treatment has been planned according to current medical practices, but problems sometimes occur. Call your health care provider if you have any problems or questions after your procedure. °WHAT TO EXPECT AFTER THE PROCEDURE  °After your procedure, it is typical to have the following: °· A small amount of blood in your stool. °· Moderate amounts of gas and mild abdominal cramping or bloating. °HOME CARE INSTRUCTIONS °· Do not drive, operate machinery, or sign important documents for 24 hours. °· You may shower and resume your regular physical activities, but move at a slower pace for the first 24 hours. °· Take frequent rest periods for the first 24 hours. °· Walk around or put a warm pack on your abdomen to help reduce abdominal cramping and bloating. °· Drink enough fluids to keep your urine clear or pale yellow. °· You may resume your normal diet as instructed by your health care provider. Avoid heavy or fried foods that are hard to digest. °· Avoid drinking alcohol for 24 hours or as instructed by your health care provider. °· Only take over-the-counter or prescription medicines as directed by your health care provider. °· If a tissue sample (biopsy) was taken during your procedure: °¨ Do not take aspirin or blood thinners for 7 days, or as instructed by your health care provider. °¨ Do not drink alcohol for 7 days, or as instructed by your health care provider. °¨ Eat soft foods for the first 24 hours. °SEEK MEDICAL CARE IF: °You have persistent spotting of blood in your stool 2-3 days after the procedure. °SEEK IMMEDIATE MEDICAL CARE  IF: °· You have more than a small spotting of blood in your stool. °· You pass large blood clots in your stool. °· Your abdomen is swollen (distended). °· You have nausea or vomiting. °· You have a fever. °· You have increasing abdominal pain that is not relieved with medicine. °Document Released: 09/17/2003 Document Revised: 11/23/2012 Document Reviewed: 10/10/2012 °[employer]® Patient Information ©2015 [employer]. This information is not intended to replace advice given to you by your health care provider. Make sure you discuss any questions you have with your health care provider. ° ° ° °

## 2013-11-22 NOTE — H&P (Signed)
Lindsay Petty is an 73 y.o. female.   Chief Complaint: Patient here for colonoscopy. HPI: Patient is a 73 year old Caucasian female who was noted to have heme positive stool. She denies melena or rectal bleeding. She has been on low-dose aspirin. She has history of colonic adenoma. She had 6 minute adenoma removed in 2007 but did not return for followup exam is recommended by Dr. Lindalou Hose. Family history significant for colon carcinoma in brother who is age 34 at the time of diagnosis and died of unrelated causes.  Past Medical History  Diagnosis Date  . Chest pain     cardiac catheterization 2003, no major epicardial disease, small distal vessels I  . GERD (gastroesophageal reflux disease)   . Hyperlipemia   . Hypertension     renal artery ultrasound, January, 2011, no significant renal artery stenosis,  ... medications make her feel tired  . Jaw pain     etiology uncertain  . Ejection fraction     65%.Marland Kitchenecho April 2003  . Hx of cholecystectomy 03/2006    lap   . Statin intolerance     muscle weakness  . Atropine adverse reaction     atropine and sensitivity by history  . Palpitations   . Vertigo     Mild positional vertigo    Past Surgical History  Procedure Laterality Date  . Cardiac catheterization    . Breast biopsy      x2 for benign disease  . Rt knee arthroscopy    . Cholecystectomy      (lap) 2008.    Family History  Problem Relation Age of Onset  . Coronary artery disease      unknown   Social History:  reports that she has never smoked. She has never used smokeless tobacco. She reports that she does not drink alcohol or use illicit drugs.  Allergies:  Allergies  Allergen Reactions  . Bee Venom Anaphylaxis  . Atropine     Heart race   . Band-Aid Plus Antibiotic [Bacitracin-Polymyxin B] Rash  . Latex Rash    Medications Prior to Admission  Medication Sig Dispense Refill  . acetaminophen (TYLENOL) 325 MG tablet Take 650 mg by mouth every 6  (six) hours as needed.      [name] amLODipine (NORVASC) 10 MG tablet Take 1 tablet (10 mg total) by mouth daily.  90 tablet  3  . calcium carbonate (TUMS - DOSED IN MG ELEMENTAL CALCIUM) 500 MG chewable tablet Chew 1 tablet by mouth daily.      . Cholecalciferol (VITAMIN D3) 1000 UNITS CAPS Take 2 capsules by mouth daily.       . Coenzyme Q10 (COQ10) 100 MG CAPS Take 1 capsule by mouth daily.        [name] esomeprazole (NEXIUM) 20 MG capsule Take 20 mg by mouth daily at 12 noon.      . Flaxseed, Linseed, (FLAXSEED OIL) 1000 MG CAPS Take 1 capsule by mouth daily.       [name] glucosamine-chondroitin 500-400 MG tablet Take 1 tablet by mouth daily as needed.        . hydrochlorothiazide (MICROZIDE) 12.5 MG capsule Take 1 capsule (12.5 mg total) by mouth daily.  90 capsule  3  . ibuprofen (ADVIL,MOTRIN) 400 MG tablet Take 400 mg by mouth every 6 (six) hours as needed.      . Mag Aspart-Potassium Aspart (POTASSIUM & MAGNESIUM ASPARTAT PO) Take 1 capsule by mouth daily.       . magnesium  oxide (MAG-OX) 400 MG tablet Take 200 mg by mouth daily.      . Multiple Vitamin (MULTIVITAMIN) tablet Take 1 tablet by mouth daily.        . nadolol (CORGARD) 40 MG tablet Take 0.5 tablets (20 mg total) by mouth daily.  45 tablet  3    No results found for this or any previous visit (from the past 48 hour(s)). No results found.  ROS  Blood pressure 164/64, pulse 72, temperature 98.1 F (36.7 C), temperature source Oral, resp. rate 12, height 5' (1.524 m), weight 158 lb (71.668 kg), SpO2 99.00%. Physical Exam  Constitutional: She appears well-developed and well-nourished.  HENT:  Mouth/Throat: Oropharynx is clear and moist.  Eyes: Conjunctivae are normal. No scleral icterus.  Neck: No thyromegaly present.  Cardiovascular: Normal rate, regular rhythm and normal heart sounds.   No murmur heard. Respiratory: Effort normal and breath sounds normal.  GI: Soft. She exhibits no distension and no mass. There is no tenderness.   Musculoskeletal: She exhibits no edema.  Lymphadenopathy:    She has no cervical adenopathy.  Neurological: She is alert.  Skin: Skin is warm and dry.     Assessment/Plan Heme positive stool. History of colonic adenoma and family history of colon carcinoma in brother. Diagnostic colonoscopy.  Lindsay Petty U 11/22/2013, 2:02 PM

## 2013-11-22 NOTE — Op Note (Signed)
[facility] [address] [place], [address]   COLONOSCOPY PROCEDURE REPORT     EXAM DATE: 2013-12-15  PATIENT NAME:      Lindsay Petty, Lindsay Petty           MR #: [id number] BIRTHDATE:       1940-06-25      VISIT #:     [id number]  ATTENDING:     Hildred Laser, MD     STATUS:     outpatient REFERRING MD:      Monico Blitz, M.D. ASA CLASS:        Class I  INDICATIONS:  The patient is a 73 yr old female here for a colonoscopy due to PROCEDURE PERFORMED:     Colonoscopy, diagnostic, Colonoscopy with biopsy, and Colonoscopy with snare polypectomy MEDICATIONS:     Cetacaine spray for oral pharyngeal topical anesthesia, Meperidine (Demerol) 50 mg IV, and Versed 5 mg IV  ESTIMATED BLOOD LOSS:     None  CONSENT: The patient understands the risks and benefits of the procedure and understands that these risks include, but are not limited to: sedation, allergic reaction, infection, perforation and/or bleeding. Alternative means of evaluation and treatment include, among others: physical exam, x-rays, and/or surgical intervention. The patient elects to proceed with this endoscopic procedure.  DESCRIPTION OF PROCEDURE: During intra-op preparation period all mechanical & medical equipment was checked for proper function. Hand hygiene and appropriate measures for infection prevention was taken. After the risks, benefits and alternatives of the procedure were thoroughly explained, Informed consent was verified, confirmed and timeout was successfully executed by the treatment team. A digital exam revealed no abnormalities of the rectum.      The [id number] ([id number]) endoscope was introduced through the anus and advanced to the cecum, which was identified by both the appendix and ileocecal valve. No adverse events experienced. The prep was excellent.. The instrument was then slowly withdrawn as the colon was fully examined.   COLON FINDINGS: Two sessile polyps  measuring 4 mm in size were found in the transverse colon.  Multiple biopsies were performed using cold forceps.  Sample was obtained and sent to histology.   A smooth semi-pedunculated polyp measuring 7 mm in size was found in the sigmoid colon.  A polypectomy was performed using snare cautery.  The resection was complete, the polyp tissue was partially retrieved and sent to histology.   Small external hemorrhoids were found. Retroflexion was performed.  The scope was then completely withdrawn from the patient and the procedure terminated. WITHDRAWAL TIME: 15 minutes 0 seconds    ADVERSE EVENTS:      There were no immediate complications.  IMPRESSIONS:     1.  Two sessile polyps were found in the transverse colon; multiple biopsies were performed using cold forceps 2.  Semi-pedunculated polyp was found in the sigmoid colon; polypectomy was performed using snare cautery 3.  Small external hemorrhoids  RECOMMENDATIONS:     1.  Hold aspirin, aspirin products, and anti-inflammatory medication for 1 week. 2.  Await biopsy results RECALL:     Return in 5 years for Colonoscopy.  Hildred Laser, MD eSigned:  Hildred Laser, MD 15-Dec-2013 2:52 PM   cc:  CPT CODES: ICD CODES:  The ICD and CPT codes recommended by this software are interpretations from the data that the clinical staff has captured with the software.  The verification of the translation of this report to the ICD and CPT codes and modifiers is the sole responsibility  of the health care institution and practicing physician where this report was generated.  [employer] will not be held responsible for the validity of the ICD and CPT codes included on this report.  AMA assumes no liability for data contained or not contained herein. CPT is a Designer, television/film set of the [employer].   PATIENT NAME:  Lindsay Petty, Lindsay Petty MR#: [id number]

## 2013-11-27 ENCOUNTER — Encounter (HOSPITAL_COMMUNITY): Payer: Self-pay | Admitting: Internal Medicine

## 2013-12-13 ENCOUNTER — Encounter (INDEPENDENT_AMBULATORY_CARE_PROVIDER_SITE_OTHER): Payer: Self-pay | Admitting: *Deleted

## 2014-01-03 ENCOUNTER — Other Ambulatory Visit: Payer: Self-pay | Admitting: *Deleted

## 2014-01-03 MED ORDER — HYDROCHLOROTHIAZIDE 12.5 MG PO CAPS: 12.5000 mg | ORAL_CAPSULE | Freq: Every day | ORAL | Status: DC

## 2014-01-03 MED ORDER — NADOLOL 40 MG PO TABS: 20.0000 mg | ORAL_TABLET | Freq: Every day | ORAL | Status: DC

## 2014-01-19 ENCOUNTER — Other Ambulatory Visit: Payer: Self-pay | Admitting: Cardiology

## 2014-01-23 ENCOUNTER — Ambulatory Visit: Payer: Medicare Other | Admitting: Cardiology

## 2014-02-07 ENCOUNTER — Encounter (INDEPENDENT_AMBULATORY_CARE_PROVIDER_SITE_OTHER): Payer: Self-pay

## 2014-02-21 ENCOUNTER — Ambulatory Visit (INDEPENDENT_AMBULATORY_CARE_PROVIDER_SITE_OTHER): Payer: Medicare Other | Admitting: Cardiology

## 2014-02-21 ENCOUNTER — Encounter: Payer: Self-pay | Admitting: Cardiology

## 2014-02-21 VITALS — BP 138/68 | HR 70 | Ht 60.0 in | Wt 162.0 lb

## 2014-02-21 DIAGNOSIS — R002 Palpitations: Secondary | ICD-10-CM

## 2014-02-21 DIAGNOSIS — I10 Essential (primary) hypertension: Secondary | ICD-10-CM

## 2014-02-21 DIAGNOSIS — R072 Precordial pain: Secondary | ICD-10-CM

## 2014-02-21 NOTE — Assessment & Plan Note (Signed)
The patient is stable. We know from catheterization in 2003 that she had no major stenoses. She had small distal vessels. Continue medical therapy.

## 2014-02-21 NOTE — Progress Notes (Signed)
Patient ID: Lindsay Petty, female   DOB: 20-Sep-1940, 74 y.o.   MRN: [id number]    HPI The patient is seen to follow-up with history of some mild chest discomfort, hypertension, and palpitations. I saw her last November, 2014. She is stable. She has mild palpitations but is not limited. She also has mild edema intermittently that is probably venous insufficiency and affected by the use of amlodipine. This is not limiting for her in any way. She has not been having any chest pain. She has mild palpitations that don't affect her daily activities.  Allergies  Allergen Reactions  . Bee Venom Anaphylaxis  . Atropine     Heart race   . Band-Aid Plus Antibiotic [Bacitracin-Polymyxin B] Rash  . Latex Rash    Current Outpatient Prescriptions  Medication Sig Dispense Refill  . amLODipine (NORVASC) 10 MG tablet TAKE 1 TABLET BY MOUTH DAILY (Patient taking differently: 1/2 in AM 1/2 in PM) 90 tablet 3  . calcium carbonate (TUMS - DOSED IN MG ELEMENTAL CALCIUM) 500 MG chewable tablet Chew 1 tablet by mouth daily.    . Cholecalciferol (VITAMIN D3) 1000 UNITS CAPS Take 2 capsules by mouth daily.     . Coenzyme Q10 (COQ10) 100 MG CAPS Take 1 capsule by mouth daily.      [name] esomeprazole (NEXIUM) 20 MG capsule Take 20 mg by mouth daily at 12 noon.    . Flaxseed, Linseed, (FLAXSEED OIL) 1000 MG CAPS Take 1 capsule by mouth daily.     [name] glucosamine-chondroitin 500-400 MG tablet Take 1 tablet by mouth daily as needed.      . hydrochlorothiazide (MICROZIDE) 12.5 MG capsule Take 1 capsule (12.5 mg total) by mouth daily. 90 capsule 3  . ibuprofen (ADVIL,MOTRIN) 200 MG tablet Take 200 mg by mouth as needed.    . Mag Aspart-Potassium Aspart (POTASSIUM & MAGNESIUM ASPARTAT PO) Take 1 capsule by mouth daily.     . magnesium oxide (MAG-OX) 400 MG tablet Take 200 mg by mouth daily.    . Multiple Vitamin (MULTIVITAMIN) tablet Take 1 tablet by mouth daily.      . nadolol (CORGARD) 40 MG tablet Take 0.5 tablets  (20 mg total) by mouth daily. 45 tablet 3   No current facility-administered medications for this visit.    History   Social History  . Marital Status: Married    Spouse Name: N/A    Number of Children: N/A  . Years of Education: N/A   Occupational History  . retired    Social History Main Topics  . Smoking status: Never Smoker   . Smokeless tobacco: Never Used  . Alcohol Use: No  . Drug Use: No  . Sexual Activity: Not on file   Other Topics Concern  . Not on file   Social History Narrative   Retired, married.     Family History  Problem Relation Age of Onset  . Coronary artery disease      unknown    Past Medical History  Diagnosis Date  . Chest pain     cardiac catheterization 2003, no major epicardial disease, small distal vessels I  . GERD (gastroesophageal reflux disease)   . Hyperlipemia   . Hypertension     renal artery ultrasound, January, 2011, no significant renal artery stenosis,  ... medications make her feel tired  . Jaw pain     etiology uncertain  . Ejection fraction     65%.Marland Kitchenecho April 2003  . Hx of cholecystectomy  03/2006    lap   . Statin intolerance     muscle weakness  . Atropine adverse reaction     atropine and sensitivity by history  . Palpitations   . Vertigo     Mild positional vertigo    Past Surgical History  Procedure Laterality Date  . Cardiac catheterization    . Breast biopsy      x2 for benign disease  . Rt knee arthroscopy    . Cholecystectomy      (lap) 2008.  . Colonoscopy N/A 11/22/2013    Procedure: COLONOSCOPY;  Surgeon: Rogene Houston, MD;  Location: [facility];  Service: Endoscopy;  Laterality: N/A;  200    Patient Active Problem List   Diagnosis Date Noted  . Personal history of colonic polyps 10/17/2013  . Family hx of colon cancer 10/17/2013  . Guaiac positive stools 10/17/2013  . Vertigo   . Chest pain   . GERD (gastroesophageal reflux disease)   . Hyperlipemia   . Hypertension   . Ejection  fraction   . Statin intolerance   . Palpitations   . ESOPHAGEAL REFLUX 04/30/2009  . OTHER MALAISE AND FATIGUE 04/30/2009    ROS  Patient denies fever, chills, headache, sweats, rash, change in vision, change in hearing, chest pain, cough, nausea or vomiting, urinary symptoms. All other systems are reviewed and are negative.  PHYSICAL EXAM Patient is oriented to person time and place. Affect is normal. Head is atraumatic. Sclera and conjunctiva are normal. There is no jugular venous distention. Lungs are clear. Respiratory effort is nonlabored. Cardiac exam reveals S1 and S2. The abdomen is soft. There is no significant peripheral edema. There are no musculoskeletal deformities. There are no skin rashes.  Filed Vitals:   02/21/14 1348  BP: 138/68  Pulse: 70  Height: 5' (1.524 m)  Weight: 162 lb (73.483 kg)   EKG is done today and reviewed by me. EKG is normal. There is no change from the past.  ASSESSMENT & PLAN

## 2014-02-21 NOTE — Assessment & Plan Note (Signed)
She has mild palpitations. These are not limiting. No further workup is needed.

## 2014-02-21 NOTE — Patient Instructions (Signed)

## 2014-12-24 ENCOUNTER — Other Ambulatory Visit: Payer: Self-pay | Admitting: Cardiology

## 2015-02-25 ENCOUNTER — Ambulatory Visit (INDEPENDENT_AMBULATORY_CARE_PROVIDER_SITE_OTHER): Payer: Medicare Other | Admitting: Cardiology

## 2015-02-25 ENCOUNTER — Encounter: Payer: Self-pay | Admitting: Cardiology

## 2015-02-25 VITALS — BP 157/80 | HR 75 | Ht 60.0 in | Wt 165.2 lb

## 2015-02-25 DIAGNOSIS — R002 Palpitations: Secondary | ICD-10-CM

## 2015-02-25 DIAGNOSIS — R0789 Other chest pain: Secondary | ICD-10-CM

## 2015-02-25 DIAGNOSIS — R42 Dizziness and giddiness: Secondary | ICD-10-CM

## 2015-02-25 DIAGNOSIS — I10 Essential (primary) hypertension: Secondary | ICD-10-CM

## 2015-02-25 DIAGNOSIS — R0602 Shortness of breath: Secondary | ICD-10-CM

## 2015-02-25 DIAGNOSIS — R600 Localized edema: Secondary | ICD-10-CM | POA: Diagnosis not present

## 2015-02-25 MED ORDER — NADOLOL 40 MG PO TABS: 40.0000 mg | ORAL_TABLET | Freq: Every day | ORAL | Status: DC

## 2015-02-25 MED ORDER — FUROSEMIDE 40 MG PO TABS: ORAL_TABLET | ORAL | Status: DC

## 2015-02-25 NOTE — Patient Instructions (Signed)
Your physician recommends that you schedule a follow-up appointment in: [place] DR. BRANCH  Your physician has recommended you make the following change in your medication:   START LASIX 40 MG DAILY AS NEEDED FOR SWELLING   INCREASE NADOLOL 40 MG DAILY  Your physician has requested that you have an echocardiogram. Echocardiography is a painless test that uses sound waves to create images of your heart. It provides your doctor with information about the size and shape of your heart and how well your heart's chambers and valves are working. This procedure takes approximately one hour. There are no restrictions for this procedure.  Your physician has requested that you regularly monitor and record your blood pressure readings at home FOR 1 WEEK AND CALL us WITH RESULTS . Please use the same machine at the same time of day to check your readings and record them to bring to your follow-up visit.  Your physician recommends that you return for lab work in: 1 WEEK BMP/MG/TSH  Thank you for choosing [facility]!!

## 2015-02-25 NOTE — Progress Notes (Addendum)
Patient ID: Lindsay Petty, female   DOB: [birth year], 75 y.o.   MRN: [id number]     Clinical Summary Lindsay Petty is a 75 y.o.female former patient of Dr Ron Parker, this is our first visit together. She is seen for the following medical problems.  1. Chest pain - history of prior chest pain symptoms. Cath in 2003 without significant disease, she had small distal vessel disease  - denies any recent chest pain.   2. Palpitations - reports some occasional palpitations. Episode during Thanksgiving during high stress, better with xanax.  - no coffee, no sodas, no tea, no EtOH  3. HTN - compliant with meds - does not check bp regularly at home.    4. Hyperlipidemia - statin intolerance, she is diet controlled   5. LE edema - worst over the last few months. Notes some mild SOB as well.   Past Medical History  Diagnosis Date  . Chest pain     cardiac catheterization 2003, no major epicardial disease, small distal vessels I  . GERD (gastroesophageal reflux disease)   . Hyperlipemia   . Hypertension     renal artery ultrasound, January, 2011, no significant renal artery stenosis,  ... medications make her feel tired  . Jaw pain     etiology uncertain  . Ejection fraction     65%.Marland Kitchenecho April 2003  . Hx of cholecystectomy 03/2006    lap   . Statin intolerance     muscle weakness  . Atropine adverse reaction     atropine and sensitivity by history  . Palpitations   . Vertigo     Mild positional vertigo     Allergies  Allergen Reactions  . Bee Venom Anaphylaxis  . Atropine     Heart race   . Band-Aid Plus Antibiotic [Bacitracin-Polymyxin B] Rash  . Latex Rash     Current Outpatient Prescriptions  Medication Sig Dispense Refill  . amLODipine (NORVASC) 10 MG tablet TAKE 1 TABLET BY MOUTH DAILY (Patient taking differently: 1/2 in AM 1/2 in PM) 90 tablet 3  . calcium carbonate (TUMS - DOSED IN MG ELEMENTAL CALCIUM) 500 MG chewable tablet Chew 1 tablet by mouth daily.     . Cholecalciferol (VITAMIN D3) 1000 UNITS CAPS Take 2 capsules by mouth daily.     . Coenzyme Q10 (COQ10) 100 MG CAPS Take 1 capsule by mouth daily.      [name] esomeprazole (NEXIUM) 20 MG capsule Take 20 mg by mouth daily at 12 noon.    . Flaxseed, Linseed, (FLAXSEED OIL) 1000 MG CAPS Take 1 capsule by mouth daily.     [name] glucosamine-chondroitin 500-400 MG tablet Take 1 tablet by mouth daily as needed.      . hydrochlorothiazide (MICROZIDE) 12.5 MG capsule TAKE ONE CAPSULE BY MOUTH ONCE DAILY 90 capsule 0  . ibuprofen (ADVIL,MOTRIN) 200 MG tablet Take 200 mg by mouth as needed.    . Mag Aspart-Potassium Aspart (POTASSIUM & MAGNESIUM ASPARTAT PO) Take 1 capsule by mouth daily.     . magnesium oxide (MAG-OX) 400 MG tablet Take 200 mg by mouth daily.    . Multiple Vitamin (MULTIVITAMIN) tablet Take 1 tablet by mouth daily.      . nadolol (CORGARD) 40 MG tablet TAKE ONE-HALF TABLET BY MOUTH ONCE DAILY 45 tablet 0   No current facility-administered medications for this visit.     Past Surgical History  Procedure Laterality Date  . Cardiac catheterization    . Breast biopsy  x2 for benign disease  . Rt knee arthroscopy    . Cholecystectomy      (lap) 2008.  . Colonoscopy N/A 11/22/2013    Procedure: COLONOSCOPY;  Surgeon: Rogene Houston, MD;  Location: [facility];  Service: Endoscopy;  Laterality: N/A;  200     Allergies  Allergen Reactions  . Bee Venom Anaphylaxis  . Atropine     Heart race   . Band-Aid Plus Antibiotic [Bacitracin-Polymyxin B] Rash  . Latex Rash      Family History  Problem Relation Age of Onset  . Coronary artery disease      unknown     Social History Lindsay Petty reports that she has never smoked. She has never used smokeless tobacco. Lindsay Petty reports that she does not drink alcohol.   Review of Systems CONSTITUTIONAL: No weight loss, fever, chills, weakness or fatigue.  HEENT: Eyes: No visual loss, blurred vision, double vision or  yellow sclerae.No hearing loss, sneezing, congestion, runny nose or sore throat.  SKIN: No rash or itching.  CARDIOVASCULAR: per HPI RESPIRATORY: No cough or sputum.  GASTROINTESTINAL: No anorexia, nausea, vomiting or diarrhea. No abdominal pain or blood.  GENITOURINARY: No burning on urination, no polyuria NEUROLOGICAL: No headache, dizziness, syncope, paralysis, ataxia, numbness or tingling in the extremities. No change in bowel or bladder control.  MUSCULOSKELETAL: No muscle, back pain, joint pain or stiffness.  LYMPHATICS: No enlarged nodes. No history of splenectomy.  PSYCHIATRIC: No history of depression or anxiety.  ENDOCRINOLOGIC: No reports of sweating, cold or heat intolerance. No polyuria or polydipsia.  [name]   Physical Examination Filed Vitals:   02/25/15 1428  BP: 157/80  Pulse: 75   Filed Vitals:   02/25/15 1428  Height: 5' (1.524 m)  Weight: 165 lb 3.2 oz (74.934 kg)    Gen: resting comfortably, no acute distress HEENT: no scleral icterus, pupils equal round and reactive, no palptable cervical adenopathy,  CV: RRR, no m/r/g, no jvd Resp: Clear to auscultation bilaterally GI: abdomen is soft, non-tender, non-distended, normal bowel sounds, no hepatosplenomegaly MSK: extremities are warm, 1+ bilateral  edema.  Skin: warm, no rash Neuro:  no focal deficits Psych: appropriate affect   Diagnostic Studies 05/2001 cath ANGIOGRAPHIC DATA: 1. Ventriculography was performed in the RAO projection. Overall left  ventricular function was preserved and no segmental abnormalities,  contraction were identified. Ejection fraction was calculated at 61%. I  could not appreciate significant mitral regurgitation.  2. The left main coronary artery appeared free of critical disease.  3. The left anterior descending artery coursed to the apex. There were four  small diagonal branches. The LAD as well as the other terminal branches of  the coronary artery all  tapered rather quickly and were fairly small in  caliber.  4. There is a small ramus that was free of critical disease.  5. There was a circumflex that provided two marginals that appeared to be free  of significant disease. Again, the vessels terminated distally as small  vessels.  6. The right coronary artery was a dominant vessel. There was a small acute  marginal Breona Cherubin in terms of caliber. There was a moderate sized  posterolateral Aliena Ghrist. All of these were without obvious focal narrowing.  CONCLUSIONS: 1. Preserved left ventricular function. 2. 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  Feb 25 2015 clinic EKG (performed and reviewed): NSR  Assessment and Plan   1. Chest pain - no  current symptoms, previous cath with minimal disease. - continue to follow  2. Palpitations - increased symptoms recently,will increase nadolol to 40mg  daily.   3. HTN - above goal, increase nadolol as described above - she will submit bp log in 1 week  4. LE edema - increasing over the last few weeks, some increase in SOB as well - start lasix 40mg  prn. Check BMET, Mg, TSH - check echo   F/u 6 weeks Arnoldo Lenis, M.D.

## 2015-02-26 DIAGNOSIS — R002 Palpitations: Secondary | ICD-10-CM | POA: Diagnosis not present

## 2015-02-27 ENCOUNTER — Other Ambulatory Visit: Payer: Self-pay

## 2015-02-27 ENCOUNTER — Ambulatory Visit (INDEPENDENT_AMBULATORY_CARE_PROVIDER_SITE_OTHER): Payer: Medicare Other

## 2015-02-27 DIAGNOSIS — R0602 Shortness of breath: Secondary | ICD-10-CM

## 2015-02-28 ENCOUNTER — Other Ambulatory Visit: Payer: Medicare Other

## 2015-02-28 ENCOUNTER — Telehealth: Payer: Self-pay | Admitting: *Deleted

## 2015-02-28 NOTE — Telephone Encounter (Signed)
-----   Message from Arnoldo Lenis, MD sent at 02/28/2015 12:54 PM EST ----- Labs look good  Zandra Abts MD

## 2015-02-28 NOTE — Telephone Encounter (Signed)
-----   Message from Arnoldo Lenis, MD sent at 02/28/2015 12:53 PM EST ----- Echo overall looks good, her heart pumping function is normal. Her heart muscle is mildly thicker and stiffer than normal, this is common with aging but could explain some of her increased swelling in her legs. Will discuss in detail at our follow up   Lindsay Abts MD

## 2015-02-28 NOTE — Telephone Encounter (Signed)
Pt verbalized understanding and confirmed f/u appt. Routed to pcp

## 2015-02-28 NOTE — Telephone Encounter (Signed)
Pt aware, routed to pcp 

## 2015-03-04 DIAGNOSIS — Z23 Encounter for immunization: Secondary | ICD-10-CM | POA: Diagnosis not present

## 2015-04-08 ENCOUNTER — Encounter: Payer: Self-pay | Admitting: Cardiology

## 2015-04-08 ENCOUNTER — Ambulatory Visit (INDEPENDENT_AMBULATORY_CARE_PROVIDER_SITE_OTHER): Payer: Medicare Other | Admitting: Cardiology

## 2015-04-08 VITALS — BP 129/75 | HR 64 | Ht 60.0 in | Wt 163.0 lb

## 2015-04-08 DIAGNOSIS — R002 Palpitations: Secondary | ICD-10-CM | POA: Diagnosis not present

## 2015-04-08 DIAGNOSIS — R0789 Other chest pain: Secondary | ICD-10-CM

## 2015-04-08 DIAGNOSIS — R600 Localized edema: Secondary | ICD-10-CM

## 2015-04-08 DIAGNOSIS — I10 Essential (primary) hypertension: Secondary | ICD-10-CM | POA: Diagnosis not present

## 2015-04-08 MED ORDER — HYDROCHLOROTHIAZIDE 12.5 MG PO CAPS: ORAL_CAPSULE | ORAL | Status: DC

## 2015-04-08 MED ORDER — AMLODIPINE BESYLATE 5 MG PO TABS: 5.0000 mg | ORAL_TABLET | Freq: Every day | ORAL | Status: DC

## 2015-04-08 NOTE — Patient Instructions (Signed)
   New prescription sent to pharmacy for HCTZ 12.5mg  capsules.  Decrease Norvasc to 5mg  daily - new sent to [facility] today.  Continue all other medications.   Your physician has requested that you regularly monitor and record your blood pressure readings at home. Please take your readings approximately 2 hours after medication.  Take your readings x 2 weeks, then return to office for MD review.   Call office in 2 weeks with update on leg swelling.  Your physician wants you to follow up in: 6 months.  You will receive a reminder letter in the mail one-two months in advance.  If you don't receive a letter, please call our office to schedule the follow up appointment

## 2015-04-08 NOTE — Progress Notes (Signed)
Patient ID: NURIYAH BALFOUR, female   DOB: 11-29-1940, 75 y.o.   MRN: [id number]     Clinical Summary Ms. Klomp is a 75 y.o.female seen today for follow up of the following medical problems.   1. History of chest pain - history of prior chest pain symptoms. Cath in 2003 without significant disease, she had small distal vessel disease  -since our last visit denies any recent chest pain   2. Palpitations - last visit we increased nadolol to 40mg  daily for worsening palpitations - since last visit symptoms have improved. Seemed to have also been triggered by increased stress that has also improved.   3. HTN - compliant with meds    4. Hyperlipidemia - statin intolerance, she is diet controlled currentl  5. LE edema - echo 02/2015 LVEF 123456, grade I diastolic dysfunction - stable from last visit, she takes HCTZ 12.5mg  bid and lasix prn     Past Medical History  Diagnosis Date  . Chest pain     cardiac catheterization 2003, no major epicardial disease, small distal vessels I  . GERD (gastroesophageal reflux disease)   . Hyperlipemia   . Hypertension     renal artery ultrasound, January, 2011, no significant renal artery stenosis,  ... medications make her feel tired  . Jaw pain     etiology uncertain  . Ejection fraction     65%.Marland Kitchenecho April 2003  . Hx of cholecystectomy 03/2006    lap   . Statin intolerance     muscle weakness  . Atropine adverse reaction     atropine and sensitivity by history  . Palpitations   . Vertigo     Mild positional vertigo     Allergies  Allergen Reactions  . Bee Venom Anaphylaxis  . Atropine     Heart race   . Band-Aid Plus Antibiotic [Bacitracin-Polymyxin B] Rash  . Latex Rash     Current Outpatient Prescriptions  Medication Sig Dispense Refill  . ALPRAZolam (XANAX) 0.5 MG tablet Take 0.5 mg by mouth as needed for anxiety.    [name] amLODipine (NORVASC) 10 MG tablet Take 5 mg by mouth 2 (two) times daily.    . calcium  carbonate (TUMS - DOSED IN MG ELEMENTAL CALCIUM) 500 MG chewable tablet Chew 1 tablet by mouth daily.    . Cholecalciferol (VITAMIN D3) 1000 UNITS CAPS Take 2 capsules by mouth daily.     . Coenzyme Q10 (COQ10) 100 MG CAPS Take 1 capsule by mouth daily.      [name] esomeprazole (NEXIUM) 20 MG capsule Take 20 mg by mouth daily.     . Flaxseed, Linseed, (FLAXSEED OIL) 1000 MG CAPS Take 1 capsule by mouth daily.     . furosemide (LASIX) 40 MG tablet TAKE 1 TAB DAILY AS NEEDED FOR SWELLING 30 tablet 3  . glucosamine-chondroitin 500-400 MG tablet Take 1 tablet by mouth daily as needed.      . hydrochlorothiazide (MICROZIDE) 12.5 MG capsule Take one tab by mouth at noon & one tab every evening.    [name] ibuprofen (ADVIL,MOTRIN) 200 MG tablet Take 200 mg by mouth as needed.    . Mag Aspart-Potassium Aspart (POTASSIUM & MAGNESIUM ASPARTAT PO) Take 1 capsule by mouth daily.     . magnesium oxide (MAG-OX) 400 MG tablet Take 200 mg by mouth daily.    . Multiple Vitamin (MULTIVITAMIN) tablet Take 1 tablet by mouth daily.      . nadolol (CORGARD) 40 MG tablet Take 1  tablet (40 mg total) by mouth daily. 90 tablet 3  . Probiotic Product (PROBIOTIC DAILY PO) Take 1 tablet by mouth daily.    [name] pyridOXINE (VITAMIN B-6) 100 MG tablet Take 100 mg by mouth daily.     No current facility-administered medications for this visit.     Past Surgical History  Procedure Laterality Date  . Cardiac catheterization    . Breast biopsy      x2 for benign disease  . Rt knee arthroscopy    . Cholecystectomy      (lap) 2008.  . Colonoscopy N/A 11/22/2013    Procedure: COLONOSCOPY;  Surgeon: Rogene Houston, MD;  Location: [facility];  Service: Endoscopy;  Laterality: N/A;  200     Allergies  Allergen Reactions  . Bee Venom Anaphylaxis  . Atropine     Heart race   . Band-Aid Plus Antibiotic [Bacitracin-Polymyxin B] Rash  . Latex Rash      Family History  Problem Relation Age of Onset  . Coronary artery disease        unknown     Social History Ms. Badeaux reports that she has never smoked. She has never used smokeless tobacco. Ms. Olivia reports that she does not drink alcohol.   Review of Systems CONSTITUTIONAL: No weight loss, fever, chills, weakness or fatigue.  HEENT: Eyes: No visual loss, blurred vision, double vision or yellow sclerae.No hearing loss, sneezing, congestion, runny nose or sore throat.  SKIN: No rash or itching.  CARDIOVASCULAR: per HPI RESPIRATORY: No shortness of breath, cough or sputum.  GASTROINTESTINAL: No anorexia, nausea, vomiting or diarrhea. No abdominal pain or blood.  GENITOURINARY: No burning on urination, no polyuria NEUROLOGICAL: No headache, dizziness, syncope, paralysis, ataxia, numbness or tingling in the extremities. No change in bowel or bladder control.  MUSCULOSKELETAL: No muscle, back pain, joint pain or stiffness.  LYMPHATICS: No enlarged nodes. No history of splenectomy.  PSYCHIATRIC: No history of depression or anxiety.  ENDOCRINOLOGIC: No reports of sweating, cold or heat intolerance. No polyuria or polydipsia.  [name]   Physical Examination Filed Vitals:   04/08/15 1043  BP: 129/75  Pulse: 64   Filed Vitals:   04/08/15 1043  Height: 5' (1.524 m)  Weight: 163 lb (73.936 kg)    Gen: resting comfortably, no acute distress HEENT: no scleral icterus, pupils equal round and reactive, no palptable cervical adenopathy,  CV: RRR, 2/6 systolic murmur RUSB, no jvd Resp: Clear to auscultation bilaterally GI: abdomen is soft, non-tender, non-distended, normal bowel sounds, no hepatosplenomegaly MSK: extremities are warm, no edema.  Skin: warm, no rash Neuro:  no focal deficits Psych: appropriate affect   Diagnostic Studies  05/2001 cath ANGIOGRAPHIC DATA: 1. Ventriculography was performed in the RAO projection. Overall left  ventricular function was preserved and no segmental abnormalities,  contraction were identified. Ejection  fraction was calculated at 61%. I  could not appreciate significant mitral regurgitation.  2. The left main coronary artery appeared free of critical disease.  3. The left anterior descending artery coursed to the apex. There were four  small diagonal branches. The LAD as well as the other terminal branches of  the coronary artery all tapered rather quickly and were fairly small in  caliber.  4. There is a small ramus that was free of critical disease.  5. There was a circumflex that provided two marginals that appeared to be free  of significant disease. Again, the vessels terminated distally as small  vessels.  6.  The right coronary artery was a dominant vessel. There was a small acute  marginal Reeves Musick in terms of caliber. There was a moderate sized  posterolateral [place]. All of these were without obvious focal narrowing.  CONCLUSIONS: 1. Preserved left ventricular function. 2. 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  Feb 25 2015 clinic EKG (performed and reviewed): NSR  Jan 2017 echo Study Conclusions  - Left ventricle: The cavity size was normal. Wall thickness was at the upper limits of normal. Systolic function was vigorous. The estimated ejection fraction was in the range of 60% to 65%. Wall motion was normal; there were no regional wall motion abnormalities. Doppler parameters are consistent with abnormal left ventricular relaxation (grade 1 diastolic dysfunction). Normal filling pressures. - Mitral valve: There was trivial regurgitation. - Right atrium: Central venous pressure (est): 3 mm Hg. - Tricuspid valve: There was trivial regurgitation. - Pulmonary arteries: PA peak pressure: 27 mm Hg (S). - Pericardium, extracardiac: There was no pericardial effusion.  Impressions:  - Upper normal LV wall thickness with LVEF 60-65%. Grade 1 diastolic  dysfunction with normal filling pressures. Trivial mitral and tricuspid regurgitation. Normal PASP 27 mmHg.     Assessment and Plan   1. Chest pain - no current symptoms, previous cath with minimal disease. - continue to follow clinically  2. Palpitations - improved with increased nadolol, her stress has also decreased - continue to monitor  3. HTN - at goal. LE edema may potentially be due to norvasc, will decrease to 5mg  daily and follow. She is to call us in 2 weeks with update on swelling and home bp's. If alternative agent needed would consider ARB, she had cough on ACE-I previously.   4. LE edema - continue diuretics, trial of decreasing norvasc as described above.    F/u 6 months      Arnoldo Lenis, M.D.

## 2015-04-18 DIAGNOSIS — M545 Low back pain: Secondary | ICD-10-CM | POA: Diagnosis not present

## 2015-04-18 DIAGNOSIS — M25551 Pain in right hip: Secondary | ICD-10-CM | POA: Diagnosis not present

## 2015-04-23 DIAGNOSIS — L719 Rosacea, unspecified: Secondary | ICD-10-CM | POA: Diagnosis not present

## 2015-04-23 DIAGNOSIS — Z85828 Personal history of other malignant neoplasm of skin: Secondary | ICD-10-CM | POA: Diagnosis not present

## 2015-04-23 DIAGNOSIS — L570 Actinic keratosis: Secondary | ICD-10-CM | POA: Diagnosis not present

## 2015-04-24 DIAGNOSIS — Z789 Other specified health status: Secondary | ICD-10-CM | POA: Diagnosis not present

## 2015-04-24 DIAGNOSIS — Z299 Encounter for prophylactic measures, unspecified: Secondary | ICD-10-CM | POA: Diagnosis not present

## 2015-04-24 DIAGNOSIS — I10 Essential (primary) hypertension: Secondary | ICD-10-CM | POA: Diagnosis not present

## 2015-04-24 DIAGNOSIS — F419 Anxiety disorder, unspecified: Secondary | ICD-10-CM | POA: Diagnosis not present

## 2015-07-04 DIAGNOSIS — I10 Essential (primary) hypertension: Secondary | ICD-10-CM | POA: Diagnosis not present

## 2015-07-04 DIAGNOSIS — M159 Polyosteoarthritis, unspecified: Secondary | ICD-10-CM | POA: Diagnosis not present

## 2015-07-23 DIAGNOSIS — L719 Rosacea, unspecified: Secondary | ICD-10-CM | POA: Diagnosis not present

## 2015-07-23 DIAGNOSIS — Z85828 Personal history of other malignant neoplasm of skin: Secondary | ICD-10-CM | POA: Diagnosis not present

## 2015-07-23 DIAGNOSIS — L570 Actinic keratosis: Secondary | ICD-10-CM | POA: Diagnosis not present

## 2015-08-08 DIAGNOSIS — Z0000 Encounter for general adult medical examination without abnormal findings: Secondary | ICD-10-CM | POA: Diagnosis not present

## 2015-08-08 DIAGNOSIS — Z6830 Body mass index (BMI) 30.0-30.9, adult: Secondary | ICD-10-CM | POA: Diagnosis not present

## 2015-08-08 DIAGNOSIS — Z1389 Encounter for screening for other disorder: Secondary | ICD-10-CM | POA: Diagnosis not present

## 2015-08-08 DIAGNOSIS — Z7189 Other specified counseling: Secondary | ICD-10-CM | POA: Diagnosis not present

## 2015-08-08 DIAGNOSIS — Z299 Encounter for prophylactic measures, unspecified: Secondary | ICD-10-CM | POA: Diagnosis not present

## 2015-08-08 DIAGNOSIS — Z1211 Encounter for screening for malignant neoplasm of colon: Secondary | ICD-10-CM | POA: Diagnosis not present

## 2015-08-09 DIAGNOSIS — F419 Anxiety disorder, unspecified: Secondary | ICD-10-CM | POA: Diagnosis not present

## 2015-08-09 DIAGNOSIS — I10 Essential (primary) hypertension: Secondary | ICD-10-CM | POA: Diagnosis not present

## 2015-08-09 DIAGNOSIS — Z79899 Other long term (current) drug therapy: Secondary | ICD-10-CM | POA: Diagnosis not present

## 2015-08-09 DIAGNOSIS — M159 Polyosteoarthritis, unspecified: Secondary | ICD-10-CM | POA: Diagnosis not present

## 2015-08-29 DIAGNOSIS — H04123 Dry eye syndrome of bilateral lacrimal glands: Secondary | ICD-10-CM | POA: Diagnosis not present

## 2015-08-30 DIAGNOSIS — I10 Essential (primary) hypertension: Secondary | ICD-10-CM | POA: Diagnosis not present

## 2015-08-30 DIAGNOSIS — M159 Polyosteoarthritis, unspecified: Secondary | ICD-10-CM | POA: Diagnosis not present

## 2015-10-01 DIAGNOSIS — H2513 Age-related nuclear cataract, bilateral: Secondary | ICD-10-CM | POA: Diagnosis not present

## 2015-10-01 DIAGNOSIS — H538 Other visual disturbances: Secondary | ICD-10-CM | POA: Diagnosis not present

## 2015-10-07 ENCOUNTER — Ambulatory Visit (INDEPENDENT_AMBULATORY_CARE_PROVIDER_SITE_OTHER): Payer: Medicare Other | Admitting: Cardiology

## 2015-10-07 ENCOUNTER — Encounter: Payer: Self-pay | Admitting: Cardiology

## 2015-10-07 VITALS — BP 172/84 | HR 70 | Ht 60.0 in | Wt 164.2 lb

## 2015-10-07 DIAGNOSIS — I10 Essential (primary) hypertension: Secondary | ICD-10-CM | POA: Diagnosis not present

## 2015-10-07 DIAGNOSIS — R600 Localized edema: Secondary | ICD-10-CM

## 2015-10-07 DIAGNOSIS — R0789 Other chest pain: Secondary | ICD-10-CM

## 2015-10-07 DIAGNOSIS — R002 Palpitations: Secondary | ICD-10-CM | POA: Diagnosis not present

## 2015-10-07 NOTE — Progress Notes (Signed)
Clinical Summary Lindsay Petty is a 75 y.o.female seen today for follow up of the following medical problems.   1. History of chest pain - history of prior chest pain symptoms. Cath in 2003 without significant disease, she had small distal vessel disease   - no recent chest pain. No SOB or DOE  2. Palpitations - occasional palpitations, depends on activity. Occasional lightheadness/dizziness. Better with prn xanax. Strong stress component related to her symptoms.   3. HTN - compliant with meds - last visit we lowered her dose of norvasc due to LE edema, edema has improved.   4. Hyperlipidemia - statin intolerance, she is diet controlled currently  5. LE edema - echo 02/2015 LVEF 123456, grade I diastolic dysfunction - improved with lower dose norvasc. Has lasix prn.    SH: working to remodel her sunroom.  Past Medical History:  Diagnosis Date  . Atropine adverse reaction    atropine and sensitivity by history  . Chest pain    cardiac catheterization 2003, no major epicardial disease, small distal vessels I  . Ejection fraction    65%.Marland Kitchenecho April 2003  . GERD (gastroesophageal reflux disease)   . Hx of cholecystectomy 03/2006   lap   . Hyperlipemia   . Hypertension    renal artery ultrasound, January, 2011, no significant renal artery stenosis,  ... medications make her feel tired  . Jaw pain    etiology uncertain  . Palpitations   . Statin intolerance    muscle weakness  . Vertigo    Mild positional vertigo     Allergies  Allergen Reactions  . Bee Venom Anaphylaxis  . Atropine     Heart race   . Band-Aid Plus Antibiotic [Bacitracin-Polymyxin B] Rash  . Latex Rash     Current Outpatient Prescriptions  Medication Sig Dispense Refill  . ALPRAZolam (XANAX) 0.5 MG tablet Take 0.5 mg by mouth as needed for anxiety.    [name] amLODipine (NORVASC) 5 MG tablet Take 1 tablet (5 mg total) by mouth daily. 30 tablet 6  . calcium carbonate (TUMS - DOSED IN MG  ELEMENTAL CALCIUM) 500 MG chewable tablet Chew 1 tablet by mouth daily as needed.     . Cholecalciferol (VITAMIN D3) 1000 UNITS CAPS Take 1 capsule by mouth daily.     . Coenzyme Q10 (COQ10) 100 MG CAPS Take 1 capsule by mouth daily.      [name] esomeprazole (NEXIUM) 20 MG capsule Take 20 mg by mouth daily.     . Flaxseed, Linseed, (FLAXSEED OIL) 1000 MG CAPS Take 1 capsule by mouth daily.     . furosemide (LASIX) 40 MG tablet TAKE 1 TAB DAILY AS NEEDED FOR SWELLING 30 tablet 3  . glucosamine-chondroitin 500-400 MG tablet Take 1 tablet by mouth daily as needed.      . hydrochlorothiazide (MICROZIDE) 12.5 MG capsule Take one capsule by mouth at noon & one capsule by mouth every evening. 60 capsule 6  . ibuprofen (ADVIL,MOTRIN) 200 MG tablet Take 200 mg by mouth as needed.    . Mag Aspart-Potassium Aspart (POTASSIUM & MAGNESIUM ASPARTAT PO) Take 1 capsule by mouth daily.     . magnesium oxide (MAG-OX) 400 MG tablet Take 200 mg by mouth daily.    . Multiple Vitamin (MULTIVITAMIN) tablet Take 1 tablet by mouth daily.      . nadolol (CORGARD) 40 MG tablet Take 20 mg by mouth 2 (two) times daily.    . Probiotic Product (PROBIOTIC  DAILY PO) Take 1 tablet by mouth daily.    [name] pyridOXINE (VITAMIN B-6) 100 MG tablet Take 100 mg by mouth daily.     No current facility-administered medications for this visit.      Past Surgical History:  Procedure Laterality Date  . BREAST BIOPSY     x2 for benign disease  . CARDIAC CATHETERIZATION    . CHOLECYSTECTOMY     (lap) 2008.  [name] COLONOSCOPY N/A 11/22/2013   Procedure: COLONOSCOPY;  Surgeon: Rogene Houston, MD;  Location: [facility];  Service: Endoscopy;  Laterality: N/A;  200  . rt knee arthroscopy       Allergies  Allergen Reactions  . Bee Venom Anaphylaxis  . Atropine     Heart race   . Band-Aid Plus Antibiotic [Bacitracin-Polymyxin B] Rash  . Latex Rash      Family History  Problem Relation Age of Onset  . Coronary artery disease       unknown     Social History Lindsay Petty reports that she has never smoked. She has never used smokeless tobacco. Lindsay Petty reports that she does not drink alcohol.   Review of Systems CONSTITUTIONAL: No weight loss, fever, chills, weakness or fatigue.  HEENT: Eyes: No visual loss, blurred vision, double vision or yellow sclerae.No hearing loss, sneezing, congestion, runny nose or sore throat.  SKIN: No rash or itching.  CARDIOVASCULAR: per HPI RESPIRATORY: No shortness of breath, cough or sputum.  GASTROINTESTINAL: No anorexia, nausea, vomiting or diarrhea. No abdominal pain or blood.  GENITOURINARY: No burning on urination, no polyuria NEUROLOGICAL: No headache, dizziness, syncope, paralysis, ataxia, numbness or tingling in the extremities. No change in bowel or bladder control.  MUSCULOSKELETAL: No muscle, back pain, joint pain or stiffness.  LYMPHATICS: No enlarged nodes. No history of splenectomy.  PSYCHIATRIC: +anxiety  ENDOCRINOLOGIC: No reports of sweating, cold or heat intolerance. No polyuria or polydipsia.  [name]   Physical Examination Vitals:   10/07/15 1137  BP: (!) 172/84  Pulse: 70   Vitals:   10/07/15 1137  Weight: 164 lb 3.2 oz (74.5 kg)  Height: 5' (1.524 m)    Gen: resting comfortably, no acute distress HEENT: no scleral icterus, pupils equal round and reactive, no palptable cervical adenopathy,  CV: RRR, no m/r/g, no jvd Resp: Clear to auscultation bilaterally GI: abdomen is soft, non-tender, non-distended, normal bowel sounds, no hepatosplenomegaly MSK: extremities are warm, no edema.  Skin: warm, no rash Neuro:  no focal deficits Psych: appropriate affect   Diagnostic Studies 05/2001 cath ANGIOGRAPHIC DATA: 1. Ventriculography was performed in the RAO projection. Overall left  ventricular function was preserved and no segmental abnormalities,  contraction were identified. Ejection fraction was calculated at 61%. I  could not  appreciate significant mitral regurgitation.  2. The left main coronary artery appeared free of critical disease.  3. The left anterior descending artery coursed to the apex. There were four  small diagonal branches. The LAD as well as the other terminal branches of  the coronary artery all tapered rather quickly and were fairly small in  caliber.  4. There is a small ramus that was free of critical disease.  5. There was a circumflex that provided two marginals that appeared to be free  of significant disease. Again, the vessels terminated distally as small  vessels.  6. The right coronary artery was a dominant vessel. There was a small acute  marginal [place] in terms of caliber. There was a moderate sized  posterolateral Meera Vasco. All of these were without obvious focal narrowing.  CONCLUSIONS: 1. Preserved left ventricular function. 2. 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  Feb 25 2015 clinic EKG (performed and reviewed): NSR  Jan 2017 echo Study Conclusions  - Left ventricle: The cavity size was normal. Wall thickness was at the upper limits of normal. Systolic function was vigorous. The estimated ejection fraction was in the range of 60% to 65%. Wall motion was normal; there were no regional wall motion abnormalities. Doppler parameters are consistent with abnormal left ventricular relaxation (grade 1 diastolic dysfunction). Normal filling pressures. - Mitral valve: There was trivial regurgitation. - Right atrium: Central venous pressure (est): 3 mm Hg. - Tricuspid valve: There was trivial regurgitation. - Pulmonary arteries: PA peak pressure: 27 mm Hg (S). - Pericardium, extracardiac: There was no pericardial effusion.  Impressions:  - Upper normal LV wall thickness with LVEF 60-65%. Grade 1 diastolic dysfunction with normal filling pressures.  Trivial mitral and tricuspid regurgitation. Normal PASP 27 mmHg.      Assessment and Plan   1. Chest pain - no current symptoms, previous cath with minimal disease. - continue to monitor  2. Palpitations - improved with increased nadolol, her stress has also decreased - continue to follow clinically.   3. HTN - elevated in clinic, she will provide a bp log in 1 week. May be some component of white coat HTN.  - She had stopped her HCTZ previously, this could be contributing. Since taking lasix just prn would be reasonable to restart daily thiazide diuretic if needed for bp.   4. LE edema - improved with lower dose of norvasc.    F/u 6 months     Arnoldo Lenis, M.D.

## 2015-10-07 NOTE — Patient Instructions (Signed)
Medication Instructions:  Continue all current medications.  Labwork: none  Testing/Procedures: none  Follow-Up: Your physician wants you to follow up in: 6 months.  You will receive a reminder letter in the mail one-two months in advance.  If you don't receive a letter, please call our office to schedule the follow up appointment   Any Other Special Instructions Will Be Listed Below (If Applicable). Your physician has requested that you regularly monitor and record your blood pressure readings at home x 1 week.  Please take your readings approximately 2 hours after medications.  Please return log to office for MD review.    If you need a refill on your cardiac medications before your next appointment, please call your pharmacy.

## 2015-10-10 ENCOUNTER — Encounter: Payer: Self-pay | Admitting: *Deleted

## 2015-10-16 DIAGNOSIS — L820 Inflamed seborrheic keratosis: Secondary | ICD-10-CM | POA: Diagnosis not present

## 2015-10-16 DIAGNOSIS — Z85828 Personal history of other malignant neoplasm of skin: Secondary | ICD-10-CM | POA: Diagnosis not present

## 2015-10-16 DIAGNOSIS — L570 Actinic keratosis: Secondary | ICD-10-CM | POA: Diagnosis not present

## 2015-10-16 DIAGNOSIS — B078 Other viral warts: Secondary | ICD-10-CM | POA: Diagnosis not present

## 2015-10-16 DIAGNOSIS — L719 Rosacea, unspecified: Secondary | ICD-10-CM | POA: Diagnosis not present

## 2015-10-17 DIAGNOSIS — M159 Polyosteoarthritis, unspecified: Secondary | ICD-10-CM | POA: Diagnosis not present

## 2015-10-17 DIAGNOSIS — I10 Essential (primary) hypertension: Secondary | ICD-10-CM | POA: Diagnosis not present

## 2015-10-23 DIAGNOSIS — T364X5A Adverse effect of tetracyclines, initial encounter: Secondary | ICD-10-CM | POA: Diagnosis not present

## 2015-11-12 ENCOUNTER — Telehealth: Payer: Self-pay | Admitting: *Deleted

## 2015-11-12 DIAGNOSIS — E7800 Pure hypercholesterolemia, unspecified: Secondary | ICD-10-CM | POA: Diagnosis not present

## 2015-11-12 DIAGNOSIS — I10 Essential (primary) hypertension: Secondary | ICD-10-CM

## 2015-11-12 DIAGNOSIS — F419 Anxiety disorder, unspecified: Secondary | ICD-10-CM | POA: Diagnosis not present

## 2015-11-12 NOTE — Telephone Encounter (Signed)
Pt brought by BP log says she had appt w/pcp today and BP was 184/94 suggested she re start HCTZ but wanted Dr. Nelly Laurence input. Pt c/o headache last 2 days but denies any other symptoms. Will forward to provider.  BP log as follows:  10/08/15 - 159/78 10/09/15 - 146/75 10/10/15 - 144/76 10/11/15 - 146/76 10/24/15 - 144/72 10/25/15 - 146/76 11/02/15 - 142/69 11/11/15 - 143/80

## 2015-11-13 MED ORDER — NADOLOL 40 MG PO TABS: 20.0000 mg | ORAL_TABLET | Freq: Two times a day (BID) | ORAL | 3 refills | Status: DC

## 2015-11-13 MED ORDER — AMLODIPINE BESYLATE 5 MG PO TABS
5.0000 mg | ORAL_TABLET | Freq: Every day | ORAL | 3 refills | Status: DC
Start: 2015-11-13 — End: 2016-11-16

## 2015-11-13 MED ORDER — CHLORTHALIDONE 25 MG PO TABS: 12.5000 mg | ORAL_TABLET | Freq: Every day | ORAL | 3 refills | Status: DC

## 2015-11-13 NOTE — Telephone Encounter (Signed)
Pt voiced understanding, chlorthalidone sent to pharmacy. Also requested refills on amlodipine and nadolol - lab orders mailed to pt

## 2015-11-13 NOTE — Telephone Encounter (Signed)
BP's running too high, instead of HCTZ I would start a similar medication but one that's better for bp. Please start chlorthalidone 12.5mg  daily. She will need BMET and Mg in 2 weeks. Contniue to take lasix just prn. Chlorthalidone is a diuretic that will help with fluid and bp.   Zandra Abts MD

## 2015-11-28 DIAGNOSIS — L719 Rosacea, unspecified: Secondary | ICD-10-CM | POA: Diagnosis not present

## 2015-12-02 DIAGNOSIS — I10 Essential (primary) hypertension: Secondary | ICD-10-CM | POA: Diagnosis not present

## 2015-12-02 DIAGNOSIS — Z299 Encounter for prophylactic measures, unspecified: Secondary | ICD-10-CM | POA: Diagnosis not present

## 2015-12-02 DIAGNOSIS — K219 Gastro-esophageal reflux disease without esophagitis: Secondary | ICD-10-CM | POA: Diagnosis not present

## 2015-12-05 ENCOUNTER — Telehealth: Payer: Self-pay | Admitting: *Deleted

## 2015-12-05 MED ORDER — POTASSIUM CHLORIDE CRYS ER 20 MEQ PO TBCR: EXTENDED_RELEASE_TABLET | ORAL | 0 refills | Status: DC

## 2015-12-05 NOTE — Telephone Encounter (Signed)
-----   Message from Arnoldo Lenis, MD sent at 12/05/2015 12:31 PM EDT ----- Potassium is mildly decreased, have her take KCl 20 mEq daily x 3 days.   Zandra Abts MD

## 2015-12-05 NOTE — Telephone Encounter (Signed)
Pt aware - KCl sent to pharmacy - pt says she has been having palpitations almost daily - says she also takes potassium OTC on her med list

## 2015-12-05 NOTE — Telephone Encounter (Signed)
Low potassium can sometimes trigger increase in palpitations, have her update [place] next week on symptoms after she has completed her KCl  Zandra Abts MD

## 2015-12-06 NOTE — Telephone Encounter (Signed)
Pt aware and will update us next week   

## 2015-12-09 NOTE — Telephone Encounter (Signed)
Pt took last potassium yesterday - says palpitations are better hasn't had any today - and very mild yesterday - says she thinks chlorthalidone is making her face hot and red. Says her BP went up 130/90 after taking 0.5 tablet - will forward to provider for further suggestions.

## 2015-12-10 NOTE — Telephone Encounter (Signed)
Pt agreeable to continue chlorthalidone until Friday - will call and update [place] on symptoms and BP

## 2015-12-10 NOTE — Telephone Encounter (Signed)
I would consider trying to continue the chlorthalidone for now, a bp of 130/90 is quite good for her. If the symptoms continue then we can say more confidently that perhaps its medication related. Have her touch base with [place] at the end of the week and update [place]   Zandra Abts MD

## 2015-12-27 DIAGNOSIS — Z789 Other specified health status: Secondary | ICD-10-CM | POA: Diagnosis not present

## 2015-12-27 DIAGNOSIS — Z6830 Body mass index (BMI) 30.0-30.9, adult: Secondary | ICD-10-CM | POA: Diagnosis not present

## 2015-12-27 DIAGNOSIS — Z713 Dietary counseling and surveillance: Secondary | ICD-10-CM | POA: Diagnosis not present

## 2015-12-27 DIAGNOSIS — Z299 Encounter for prophylactic measures, unspecified: Secondary | ICD-10-CM | POA: Diagnosis not present

## 2015-12-27 DIAGNOSIS — J320 Chronic maxillary sinusitis: Secondary | ICD-10-CM | POA: Diagnosis not present

## 2015-12-29 DIAGNOSIS — J40 Bronchitis, not specified as acute or chronic: Secondary | ICD-10-CM | POA: Diagnosis not present

## 2015-12-29 DIAGNOSIS — J209 Acute bronchitis, unspecified: Secondary | ICD-10-CM | POA: Diagnosis not present

## 2015-12-29 DIAGNOSIS — L500 Allergic urticaria: Secondary | ICD-10-CM | POA: Diagnosis not present

## 2015-12-29 DIAGNOSIS — Z79899 Other long term (current) drug therapy: Secondary | ICD-10-CM | POA: Diagnosis not present

## 2015-12-29 DIAGNOSIS — R0602 Shortness of breath: Secondary | ICD-10-CM | POA: Diagnosis not present

## 2015-12-29 DIAGNOSIS — R05 Cough: Secondary | ICD-10-CM | POA: Diagnosis not present

## 2015-12-29 DIAGNOSIS — T360X5A Adverse effect of penicillins, initial encounter: Secondary | ICD-10-CM | POA: Diagnosis not present

## 2016-01-21 DIAGNOSIS — M25519 Pain in unspecified shoulder: Secondary | ICD-10-CM | POA: Diagnosis not present

## 2016-01-21 DIAGNOSIS — I10 Essential (primary) hypertension: Secondary | ICD-10-CM | POA: Diagnosis not present

## 2016-01-21 DIAGNOSIS — Z299 Encounter for prophylactic measures, unspecified: Secondary | ICD-10-CM | POA: Diagnosis not present

## 2016-01-29 ENCOUNTER — Ambulatory Visit (INDEPENDENT_AMBULATORY_CARE_PROVIDER_SITE_OTHER): Payer: Medicare Other | Admitting: Cardiology

## 2016-01-29 ENCOUNTER — Encounter: Payer: Self-pay | Admitting: Cardiology

## 2016-01-29 VITALS — BP 182/76 | HR 87 | Ht 60.0 in | Wt 158.0 lb

## 2016-01-29 DIAGNOSIS — R0789 Other chest pain: Secondary | ICD-10-CM

## 2016-01-29 DIAGNOSIS — I10 Essential (primary) hypertension: Secondary | ICD-10-CM | POA: Diagnosis not present

## 2016-01-29 DIAGNOSIS — R600 Localized edema: Secondary | ICD-10-CM | POA: Diagnosis not present

## 2016-01-29 DIAGNOSIS — R002 Palpitations: Secondary | ICD-10-CM

## 2016-01-29 NOTE — Patient Instructions (Signed)
Your physician recommends that you schedule a follow-up appointment in 3 Months in [place].   Your physician recommends that you continue on your current medications as directed. Please refer to the Current Medication list given to you today.  If you need a refill on your cardiac medications before your next appointment, please call your pharmacy.  Thank you for choosing [facility]!

## 2016-01-29 NOTE — Progress Notes (Signed)
Clinical Summary Lindsay Petty is a 75 y.o.female seen today for follow up of the following medical problems.   1. History of chest pain - history of prior chest pain symptoms. Cath in 2003 without significant disease, she had small distal vessel disease  - no recent chest pain. No SOB or DOE  2. Palpitations  Strong stress component related to her symptoms. Previously better with prn xanax.  - recent palpitations. She reports a virus since early November. Allergic reaction to amoxicillin. Ongoing shoulder pains. All seemed to worsen her symptoms at that time. Better with potassium replacement. Sypmtosm improved back on potassium.    3. HTN - compliant with meds - last visit we lowered her dose of norvasc due to LE edema, edema has improved.  - recently we started chlorthalidone. She thought there may have been some reacion, redness of the face however this resolved.      4. Hyperlipidemia - statin intolerance, she is diet controlled  5. LE edema - echo 02/2015 LVEF 123456, grade I diastolic dysfunction - improved with lower dose norvasc and on chlorthalidone.      Past Medical History:  Diagnosis Date  . Atropine adverse reaction    atropine and sensitivity by history  . Chest pain    cardiac catheterization 2003, no major epicardial disease, small distal vessels I  . Ejection fraction    65%.Marland Kitchenecho April 2003  . GERD (gastroesophageal reflux disease)   . Hx of cholecystectomy 03/2006   lap   . Hyperlipemia   . Hypertension    renal artery ultrasound, January, 2011, no significant renal artery stenosis,  ... medications make her feel tired  . Jaw pain    etiology uncertain  . Palpitations   . Statin intolerance    muscle weakness  . Vertigo    Mild positional vertigo     Allergies  Allergen Reactions  . Bee Venom Anaphylaxis  . Atropine     Heart race   . Band-Aid Plus Antibiotic [Bacitracin-Polymyxin B] Rash  . Latex Rash     Current  Outpatient Prescriptions  Medication Sig Dispense Refill  . ALPRAZolam (XANAX) 0.5 MG tablet Take 0.5 mg by mouth 2 (two) times daily as needed for anxiety.     [name] amLODipine (NORVASC) 5 MG tablet Take 1 tablet (5 mg total) by mouth daily. 90 tablet 3  . calcium carbonate (TUMS - DOSED IN MG ELEMENTAL CALCIUM) 500 MG chewable tablet Chew 1 tablet by mouth daily as needed.     . chlorthalidone (HYGROTON) 25 MG tablet Take 0.5 tablets (12.5 mg total) by mouth daily. 45 tablet 3  . Cholecalciferol (VITAMIN D3) 1000 UNITS CAPS Take 1 capsule by mouth daily.     . Coenzyme Q10 (COQ10) 100 MG CAPS Take 1 capsule by mouth daily.      [name] esomeprazole (NEXIUM) 20 MG capsule Take 20 mg by mouth daily.     . Flaxseed, Linseed, (FLAXSEED OIL) 1000 MG CAPS Take 1 capsule by mouth daily.     . furosemide (LASIX) 40 MG tablet TAKE 1 TAB DAILY AS NEEDED FOR SWELLING 30 tablet 3  . glucosamine-chondroitin 500-400 MG tablet Take 1 tablet by mouth daily as needed.      [name] ibuprofen (ADVIL,MOTRIN) 200 MG tablet Take 200 mg by mouth as needed.    . Mag Aspart-Potassium Aspart (POTASSIUM & MAGNESIUM ASPARTAT PO) Take 1 capsule by mouth daily.     . magnesium oxide (MAG-OX) 400 MG  tablet Take 200 mg by mouth daily.    . Multiple Vitamin (MULTIVITAMIN) tablet Take 1 tablet by mouth daily.      . nadolol (CORGARD) 40 MG tablet Take 0.5 tablets (20 mg total) by mouth 2 (two) times daily. 45 tablet 3  . potassium chloride SA (K-DUR,KLOR-CON) 20 MEQ tablet Take 1 TAB DAILY FOR 3 DAYS 30 tablet 0  . Probiotic Product (PROBIOTIC DAILY PO) Take 1 tablet by mouth daily.    [name] pyridOXINE (VITAMIN B-6) 100 MG tablet Take 100 mg by mouth daily.     No current facility-administered medications for this visit.      Past Surgical History:  Procedure Laterality Date  . BREAST BIOPSY     x2 for benign disease  . CARDIAC CATHETERIZATION    . CHOLECYSTECTOMY     (lap) 2008.  [name] COLONOSCOPY N/A 11/22/2013   Procedure: COLONOSCOPY;   Surgeon: Rogene Houston, MD;  Location: [facility];  Service: Endoscopy;  Laterality: N/A;  200  . rt knee arthroscopy       Allergies  Allergen Reactions  . Bee Venom Anaphylaxis  . Atropine     Heart race   . Band-Aid Plus Antibiotic [Bacitracin-Polymyxin B] Rash  . Latex Rash      Family History  Problem Relation Age of Onset  . Coronary artery disease      unknown     Social History Lindsay Petty reports that she has never smoked. She has never used smokeless tobacco. Lindsay Petty reports that she does not drink alcohol.   Review of Systems CONSTITUTIONAL: No weight loss, fever, chills, weakness or fatigue.  HEENT: Eyes: No visual loss, blurred vision, double vision or yellow sclerae.No hearing loss, sneezing, congestion, runny nose or sore throat.  SKIN: No rash or itching.  CARDIOVASCULAR: per hpi RESPIRATORY: No shortness of breath, cough or sputum.  GASTROINTESTINAL: No anorexia, nausea, vomiting or diarrhea. No abdominal pain or blood.  GENITOURINARY: No burning on urination, no polyuria NEUROLOGICAL: No headache, dizziness, syncope, paralysis, ataxia, numbness or tingling in the extremities. No change in bowel or bladder control.  MUSCULOSKELETAL: No muscle, back pain, joint pain or stiffness.  LYMPHATICS: No enlarged nodes. No history of splenectomy.  PSYCHIATRIC: No history of depression or anxiety.  ENDOCRINOLOGIC: No reports of sweating, cold or heat intolerance. No polyuria or polydipsia.  [name]   Physical Examination Vitals:   01/29/16 1413  BP: (!) 182/76  Pulse: 87   Vitals:   01/29/16 1413  Weight: 158 lb (71.7 kg)  Height: 5' (1.524 m)    Gen: resting comfortably, no acute distress HEENT: no scleral icterus, pupils equal round and reactive, no palptable cervical adenopathy,  CV: RRR, no m/r/g, no jvd Resp: Clear to auscultation bilaterally GI: abdomen is soft, non-tender, non-distended, normal bowel sounds, no hepatosplenomegaly MSK:  extremities are warm, no edema.  Skin: warm, no rash Neuro:  no focal deficits Psych: appropriate affect   Diagnostic Studies 05/2001 cath ANGIOGRAPHIC DATA: 1. Ventriculography was performed in the RAO projection. Overall left  ventricular function was preserved and no segmental abnormalities,  contraction were identified. Ejection fraction was calculated at 61%. I  could not appreciate significant mitral regurgitation.  2. The left main coronary artery appeared free of critical disease.  3. The left anterior descending artery coursed to the apex. There were four  small diagonal branches. The LAD as well as the other terminal branches of  the coronary artery all tapered rather quickly and  were fairly small in  caliber.  4. There is a small ramus that was free of critical disease.  5. There was a circumflex that provided two marginals that appeared to be free  of significant disease. Again, the vessels terminated distally as small  vessels.  6. The right coronary artery was a dominant vessel. There was a small acute  marginal Baron Parmelee in terms of caliber. There was a moderate sized  posterolateral Lorenzo Pereyra. All of these were without obvious focal narrowing.  CONCLUSIONS: 1. Preserved left ventricular function. 2. 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  Feb 25 2015 clinic EKG (performed and reviewed): NSR  Jan 2017 echo Study Conclusions  - Left ventricle: The cavity size was normal. Wall thickness was at the upper limits of normal. Systolic function was vigorous. The estimated ejection fraction was in the range of 60% to 65%. Wall motion was normal; there were no regional wall motion abnormalities. Doppler parameters are consistent with abnormal left ventricular relaxation (grade 1 diastolic dysfunction). Normal filling pressures. - Mitral valve: There  was trivial regurgitation. - Right atrium: Central venous pressure (est): 3 mm Hg. - Tricuspid valve: There was trivial regurgitation. - Pulmonary arteries: PA peak pressure: 27 mm Hg (S). - Pericardium, extracardiac: There was no pericardial effusion.  Impressions:  - Upper normal LV wall thickness with LVEF 60-65%. Grade 1 diastolic dysfunction with normal filling pressures. Trivial mitral and tricuspid regurgitation. Normal PASP 27 mmHg.       Assessment and Plan   1. Chest pain - no current symptoms, previous cath with minimal disease. - we will continue to monitor  2. Palpitations - seems to be doing well currently. If recurrence can consider home event monitor.   3. HTN - elevated in clinic, home numbers show reasonable control - we will conitnue current meds  4. LE edema - improved with lower dose of norvasc.  - continue to monitor     Arnoldo Lenis, M.D.,

## 2016-02-13 DIAGNOSIS — Z299 Encounter for prophylactic measures, unspecified: Secondary | ICD-10-CM | POA: Diagnosis not present

## 2016-02-13 DIAGNOSIS — Z6829 Body mass index (BMI) 29.0-29.9, adult: Secondary | ICD-10-CM | POA: Diagnosis not present

## 2016-02-13 DIAGNOSIS — I10 Essential (primary) hypertension: Secondary | ICD-10-CM | POA: Diagnosis not present

## 2016-02-13 DIAGNOSIS — Z713 Dietary counseling and surveillance: Secondary | ICD-10-CM | POA: Diagnosis not present

## 2016-02-13 DIAGNOSIS — F419 Anxiety disorder, unspecified: Secondary | ICD-10-CM | POA: Diagnosis not present

## 2016-02-26 ENCOUNTER — Telehealth: Payer: Self-pay | Admitting: Cardiology

## 2016-02-26 DIAGNOSIS — I10 Essential (primary) hypertension: Secondary | ICD-10-CM

## 2016-02-26 NOTE — Telephone Encounter (Signed)
Does not need to continue daily KCl. Please repeat BMET and Mg in 1 month  Zandra Abts MD

## 2016-02-26 NOTE — Telephone Encounter (Signed)
Refill :   potassium chloride SA (K-DUR,KLOR-CON) 20       [facility]   Requesting 90 day supply

## 2016-02-26 NOTE — Telephone Encounter (Signed)
Does pt still need to be taking potassium? Last lab results had her on this for 3 days

## 2016-02-26 NOTE — Addendum Note (Signed)
Addended by: Julian Hy T on: 02/26/2016 04:45 PM   Modules accepted: Orders

## 2016-02-26 NOTE — Telephone Encounter (Signed)
Pt aware - will mail pt lab orders

## 2016-03-02 DIAGNOSIS — Z85828 Personal history of other malignant neoplasm of skin: Secondary | ICD-10-CM | POA: Diagnosis not present

## 2016-03-02 DIAGNOSIS — L570 Actinic keratosis: Secondary | ICD-10-CM | POA: Diagnosis not present

## 2016-03-23 DIAGNOSIS — I10 Essential (primary) hypertension: Secondary | ICD-10-CM | POA: Diagnosis not present

## 2016-03-23 DIAGNOSIS — M159 Polyosteoarthritis, unspecified: Secondary | ICD-10-CM | POA: Diagnosis not present

## 2016-04-13 DIAGNOSIS — I10 Essential (primary) hypertension: Secondary | ICD-10-CM | POA: Diagnosis not present

## 2016-04-16 ENCOUNTER — Telehealth: Payer: Self-pay | Admitting: *Deleted

## 2016-04-16 NOTE — Telephone Encounter (Signed)
-----   Message from Arnoldo Lenis, MD sent at 04/15/2016  7:49 PM EST ----- Labs look good. No med changes  Zandra Abts MD

## 2016-04-16 NOTE — Telephone Encounter (Signed)
Pt aware - routed to pcp  

## 2016-05-04 ENCOUNTER — Ambulatory Visit (INDEPENDENT_AMBULATORY_CARE_PROVIDER_SITE_OTHER): Payer: Medicare Other | Admitting: Cardiology

## 2016-05-04 ENCOUNTER — Encounter: Payer: Self-pay | Admitting: Cardiology

## 2016-05-04 VITALS — BP 147/73 | HR 70 | Ht 60.0 in | Wt 161.8 lb

## 2016-05-04 DIAGNOSIS — R002 Palpitations: Secondary | ICD-10-CM

## 2016-05-04 DIAGNOSIS — I10 Essential (primary) hypertension: Secondary | ICD-10-CM | POA: Diagnosis not present

## 2016-05-04 DIAGNOSIS — R600 Localized edema: Secondary | ICD-10-CM | POA: Diagnosis not present

## 2016-05-04 DIAGNOSIS — R0789 Other chest pain: Secondary | ICD-10-CM | POA: Diagnosis not present

## 2016-05-04 NOTE — Progress Notes (Addendum)
Clinical Summary Ms. Oldfield is a 76 y.o.female seen today for follow up of the following medical problems.   1. History of chest pain - history of prior chest pain symptoms. Cath in 2003 without significant disease, she had small distal vessel disease  - bleeding with ASA in the past  - no recent chest pain/SOB/DOE  2. Palpitations - still with palpitations at time. No specific triggers.  - compliant with nadolol. Episodes occur daily.   3. HTN - compliant with meds -  we lowered her dose of norvasc due to LE edema, edema has improved.  - compliant with meds     4. Hyperlipidemia - statin intolerance, she is diet controlled - she reports recent labs by pcp  5. LE edema - echo 02/2015 LVEF 29-47%, grade I diastolic dysfunction - improved with lower dose norvasc and on chlorthalidone.     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 Keeps her grandkids 43 and 81 years old.   Past Medical History:  Diagnosis Date  . Atropine adverse reaction    atropine and sensitivity by history  . Chest pain    cardiac catheterization 2003, no major epicardial disease, small distal vessels I  . Ejection fraction    65%.Marland Kitchenecho April 2003  . GERD (gastroesophageal reflux disease)   . Hx of cholecystectomy 03/2006   lap   . Hyperlipemia   . Hypertension    renal artery ultrasound, January, 2011, no significant renal artery stenosis,  ... medications make her feel tired  . Jaw pain    etiology uncertain  . Palpitations   . Statin intolerance    muscle weakness  . Vertigo    Mild positional vertigo     Allergies  Allergen Reactions  . Bee Venom Anaphylaxis  . Atropine     Heart race   . Band-Aid Plus Antibiotic [Bacitracin-Polymyxin B] Rash  . Latex Rash     Current Outpatient Prescriptions  Medication Sig Dispense Refill  . ALPRAZolam (XANAX) 0.5 MG tablet Take 0.5 mg by mouth 2 (two) times daily as needed for anxiety.     [name] amLODipine (NORVASC) 5  MG tablet Take 1 tablet (5 mg total) by mouth daily. 90 tablet 3  . calcium carbonate (TUMS - DOSED IN MG ELEMENTAL CALCIUM) 500 MG chewable tablet Chew 1 tablet by mouth daily as needed.     . chlorthalidone (HYGROTON) 25 MG tablet Take 0.5 tablets (12.5 mg total) by mouth daily. 45 tablet 3  . Cholecalciferol (VITAMIN D3) 1000 UNITS CAPS Take 1 capsule by mouth daily.     . Coenzyme Q10 (COQ10) 100 MG CAPS Take 1 capsule by mouth daily.      [name] esomeprazole (NEXIUM) 20 MG capsule Take 20 mg by mouth daily.     . Flaxseed, Linseed, (FLAXSEED OIL) 1000 MG CAPS Take 1 capsule by mouth daily.     [name] ibuprofen (ADVIL,MOTRIN) 200 MG tablet Take 200 mg by mouth as needed.    . Mag Aspart-Potassium Aspart (POTASSIUM & MAGNESIUM ASPARTAT PO) Take 1 capsule by mouth daily.     . Multiple Vitamin (MULTIVITAMIN) tablet Take 1 tablet by mouth daily.      . nadolol (CORGARD) 40 MG tablet Take 0.5 tablets (20 mg total) by mouth 2 (two) times daily. 45 tablet 3  . Probiotic Product (PROBIOTIC DAILY PO) Take 1 tablet by mouth daily.    [name] pyridOXINE (VITAMIN B-6) 100 MG tablet Take 100 mg by mouth  daily.     No current facility-administered medications for this visit.      Past Surgical History:  Procedure Laterality Date  . BREAST BIOPSY     x2 for benign disease  . CARDIAC CATHETERIZATION    . CHOLECYSTECTOMY     (lap) 2008.  [name] COLONOSCOPY N/A 11/22/2013   Procedure: COLONOSCOPY;  Surgeon: Rogene Houston, MD;  Location: [facility];  Service: Endoscopy;  Laterality: N/A;  200  . rt knee arthroscopy       Allergies  Allergen Reactions  . Bee Venom Anaphylaxis  . Atropine     Heart race   . Band-Aid Plus Antibiotic [Bacitracin-Polymyxin B] Rash  . Latex Rash      Family History  Problem Relation Age of Onset  . Coronary artery disease      unknown     Social History Ms. Berkland reports that she has never smoked. She has never used smokeless tobacco. Ms. Folds reports that  she does not drink alcohol.   Review of Systems CONSTITUTIONAL: No weight loss, fever, chills, weakness or fatigue.  HEENT: Eyes: No visual loss, blurred vision, double vision or yellow sclerae.No hearing loss, sneezing, congestion, runny nose or sore throat.  SKIN: No rash or itching.  CARDIOVASCULAR: per hpi RESPIRATORY: No shortness of breath, cough or sputum.  GASTROINTESTINAL: No anorexia, nausea, vomiting or diarrhea. No abdominal pain or blood.  GENITOURINARY: No burning on urination, no polyuria NEUROLOGICAL: No headache, dizziness, syncope, paralysis, ataxia, numbness or tingling in the extremities. No change in bowel or bladder control.  MUSCULOSKELETAL: No muscle, back pain, joint pain or stiffness.  LYMPHATICS: No enlarged nodes. No history of splenectomy.  PSYCHIATRIC: No history of depression or anxiety.  ENDOCRINOLOGIC: No reports of sweating, cold or heat intolerance. No polyuria or polydipsia.  [name]   Physical Examination Vitals:   05/04/16 1142  BP: (!) 147/73  Pulse: 70   Vitals:   05/04/16 1142  Weight: 161 lb 12.8 oz (73.4 kg)  Height: 5' (1.524 m)    Gen: resting comfortably, no acute distress HEENT: no scleral icterus, pupils equal round and reactive, no palptable cervical adenopathy,  CV: RRR, no m/r/,g no jvd Resp: Clear to auscultation bilaterally GI: abdomen is soft, non-tender, non-distended, normal bowel sounds, no hepatosplenomegaly MSK: extremities are warm, no edema.  Skin: warm, no rash Neuro:  no focal deficits Psych: appropriate affect   Diagnostic Studies 05/2001 cath ANGIOGRAPHIC DATA: 1. Ventriculography was performed in the RAO projection. Overall left  ventricular function was preserved and no segmental abnormalities,  contraction were identified. Ejection fraction was calculated at 61%. I  could not appreciate significant mitral regurgitation.  2. The left main coronary artery appeared free of critical disease.  3.  The left anterior descending artery coursed to the apex. There were four  small diagonal branches. The LAD as well as the other terminal branches of  the coronary artery all tapered rather quickly and were fairly small in  caliber.  4. There is a small ramus that was free of critical disease.  5. There was a circumflex that provided two marginals that appeared to be free  of significant disease. Again, the vessels terminated distally as small  vessels.  6. The right coronary artery was a dominant vessel. There was a small acute  marginal Nataliya Graig in terms of caliber. There was a moderate sized  posterolateral Calli Bashor. All of these were without obvious focal narrowing.  CONCLUSIONS: 1. Preserved left ventricular function.  2. 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  Feb 25 2015 clinic EKG (performed and reviewed): NSR  Jan 2017 echo Study Conclusions  - Left ventricle: The cavity size was normal. Wall thickness was at the upper limits of normal. Systolic function was vigorous. The estimated ejection fraction was in the range of 60% to 65%. Wall motion was normal; there were no regional wall motion abnormalities. Doppler parameters are consistent with abnormal left ventricular relaxation (grade 1 diastolic dysfunction). Normal filling pressures. - Mitral valve: There was trivial regurgitation. - Right atrium: Central venous pressure (est): 3 mm Hg. - Tricuspid valve: There was trivial regurgitation. - Pulmonary arteries: PA peak pressure: 27 mm Hg (S). - Pericardium, extracardiac: There was no pericardial effusion.  Impressions:  - Upper normal LV wall thickness with LVEF 60-65%. Grade 1 diastolic dysfunction with normal filling pressures. Trivial mitral and tricuspid regurgitation. Normal PASP 27 mmHg.        Assessment and Plan  1. Chest pain -   previous cath with minimal disease. No current symptoms - continue to follow cliniclaly.   2. Palpitations - ongoing symptoms. We will obtain a 7 day monitor. May need to titrate nadolol pending results.   3. HTN -mildly elevated in clinic,home numbers are at goal.  follow trend. Anticipate increased nadolol in near future for ongoing palpitatoins.   4. LE edema - improved with lower dose of norvasc.  - we will continue to monitor   F/u  3 months    Arnoldo Lenis, M.D.

## 2016-05-04 NOTE — Patient Instructions (Signed)
Your physician recommends that you schedule a follow-up appointment in: 3 MONTHS WITH DR BRANCH  Your physician recommends that you continue on your current medications as directed. Please refer to the Current Medication list given to you today.  Your physician has recommended that you wear an event monitor FOR 7 DAYS. Event monitors are medical devices that record the heart's electrical activity. Doctors most often us these monitors to diagnose arrhythmias. Arrhythmias are problems with the speed or rhythm of the heartbeat. The monitor is a small, portable device. You can wear one while you do your normal daily activities. This is usually used to diagnose what is causing palpitations/syncope (passing out).  Thank you for choosing  [facility]!!     

## 2016-05-07 DIAGNOSIS — R002 Palpitations: Secondary | ICD-10-CM | POA: Diagnosis not present

## 2016-05-08 ENCOUNTER — Ambulatory Visit (INDEPENDENT_AMBULATORY_CARE_PROVIDER_SITE_OTHER): Payer: Medicare Other

## 2016-05-08 DIAGNOSIS — R002 Palpitations: Secondary | ICD-10-CM

## 2016-05-20 ENCOUNTER — Telehealth: Payer: Self-pay | Admitting: *Deleted

## 2016-05-20 DIAGNOSIS — Z299 Encounter for prophylactic measures, unspecified: Secondary | ICD-10-CM | POA: Diagnosis not present

## 2016-05-20 DIAGNOSIS — Z6830 Body mass index (BMI) 30.0-30.9, adult: Secondary | ICD-10-CM | POA: Diagnosis not present

## 2016-05-20 DIAGNOSIS — Z713 Dietary counseling and surveillance: Secondary | ICD-10-CM | POA: Diagnosis not present

## 2016-05-20 DIAGNOSIS — I10 Essential (primary) hypertension: Secondary | ICD-10-CM | POA: Diagnosis not present

## 2016-05-20 NOTE — Telephone Encounter (Signed)
Pt says she is still having symptoms - verified nadolol 20 mg bid - says she will increase 40 mg in the morning and 20 mg in the evening - updated medication list - routed to pcp

## 2016-05-20 NOTE — Telephone Encounter (Signed)
-----   Message from Arnoldo Lenis, MD sent at 05/20/2016  2:46 PM EDT ----- Heart monitor shows just some occasional extra heart beats, no significant abnormal rhythms. If still having symptoms we can increase her nadolol. Verify taking nadolol 20mg  bid, and if so increase day time dose to 40mg  and keep evenign dose at 20mg .  Zandra Abts MD

## 2016-05-21 DIAGNOSIS — L719 Rosacea, unspecified: Secondary | ICD-10-CM | POA: Diagnosis not present

## 2016-05-21 DIAGNOSIS — L570 Actinic keratosis: Secondary | ICD-10-CM | POA: Diagnosis not present

## 2016-07-09 DIAGNOSIS — I10 Essential (primary) hypertension: Secondary | ICD-10-CM | POA: Diagnosis not present

## 2016-07-09 DIAGNOSIS — M159 Polyosteoarthritis, unspecified: Secondary | ICD-10-CM | POA: Diagnosis not present

## 2016-08-04 ENCOUNTER — Encounter: Payer: Self-pay | Admitting: Cardiology

## 2016-08-04 ENCOUNTER — Ambulatory Visit (INDEPENDENT_AMBULATORY_CARE_PROVIDER_SITE_OTHER): Payer: Medicare Other | Admitting: Cardiology

## 2016-08-04 VITALS — BP 155/81 | HR 68 | Ht 60.0 in | Wt 159.0 lb

## 2016-08-04 DIAGNOSIS — R002 Palpitations: Secondary | ICD-10-CM

## 2016-08-04 DIAGNOSIS — I10 Essential (primary) hypertension: Secondary | ICD-10-CM | POA: Diagnosis not present

## 2016-08-04 DIAGNOSIS — R0789 Other chest pain: Secondary | ICD-10-CM | POA: Diagnosis not present

## 2016-08-04 DIAGNOSIS — R600 Localized edema: Secondary | ICD-10-CM

## 2016-08-04 MED ORDER — NADOLOL 40 MG PO TABS: 40.0000 mg | ORAL_TABLET | Freq: Two times a day (BID) | ORAL | 3 refills | Status: DC

## 2016-08-04 NOTE — Patient Instructions (Signed)
Medication Instructions:  Your physician recommends that you continue on your current medications as directed. Please refer to the Current Medication list given to you today.  Labwork: NONE  Testing/Procedures: NONE  Follow-Up: Your physician wants you to follow-up in: 6 MONTHS WITH DR. BRANCH. You will receive a reminder letter in the mail two months in advance. If you don't receive a letter, please call our office to schedule the follow-up appointment.  Any Other Special Instructions Will Be Listed Below (If Applicable).  If you need a refill on your cardiac medications before your next appointment, please call your pharmacy. 

## 2016-08-04 NOTE — Progress Notes (Signed)
Clinical Summary Ms. Whitmyer is a 76 y.o.female seen today for follow up of the following medical problems.   1. History of chest pain - history of prior chest pain symptoms. Cath in 2003 without significant disease, she had small distal vessel disease  - bleeding with ASA in the past   - denies any symptoms since last visit   2. Palpitations  - 04/2016 event monitor showed symptoms correlated with SR and PACs. We increased nadolol to 40mg  in am and 20mg  in pm - no recent symptoms, improved with higher dose of nadolol.    3. HTN - compliant with meds -  we lowered her dose of norvasc due to LE edema, edema has improved.   - home bp's show 130/70-80s  4. Hyperlipidemia - statin intolerance, she is diet controlled - she reports recent labs by pcp  - has labs upcoming next week with pcp  5. LE edema - echo 02/2015 LVEF 64-40%, grade I diastolic dysfunction - improved with lower dose norvasc and on chlorthalidone.   - just occasional at times, can occur after NSAIDs.   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 Keeps her grandkids 2 and 78 years old. Has one great grandchild 8 months.    Past Medical History:  Diagnosis Date  . Atropine adverse reaction    atropine and sensitivity by history  . Chest pain    cardiac catheterization 2003, no major epicardial disease, small distal vessels I  . Ejection fraction    65%.Marland Kitchenecho April 2003  . GERD (gastroesophageal reflux disease)   . Hx of cholecystectomy 03/2006   lap   . Hyperlipemia   . Hypertension    renal artery ultrasound, January, 2011, no significant renal artery stenosis,  ... medications make her feel tired  . Jaw pain    etiology uncertain  . Palpitations   . Statin intolerance    muscle weakness  . Vertigo    Mild positional vertigo     Allergies  Allergen Reactions  . Bee Venom Anaphylaxis  . Amoxicillin Hives  . Atropine     Heart race   . Doxycycline Swelling  .  Band-Aid Plus Antibiotic [Bacitracin-Polymyxin B] Rash  . Latex Rash     Current Outpatient Prescriptions  Medication Sig Dispense Refill  . ALPRAZolam (XANAX) 0.5 MG tablet Take 0.5 mg by mouth 2 (two) times daily as needed for anxiety.     [name] amLODipine (NORVASC) 5 MG tablet Take 1 tablet (5 mg total) by mouth daily. 90 tablet 3  . calcium carbonate (TUMS - DOSED IN MG ELEMENTAL CALCIUM) 500 MG chewable tablet Chew 1 tablet by mouth daily as needed.     . chlorthalidone (HYGROTON) 25 MG tablet Take 25 mg by mouth daily.    . Cholecalciferol (VITAMIN D3) 1000 UNITS CAPS Take 1 capsule by mouth daily.     . Coenzyme Q10 (COQ10) 100 MG CAPS Take 1 capsule by mouth daily.      [name] esomeprazole (NEXIUM) 20 MG capsule Take 20 mg by mouth daily.     . Flaxseed, Linseed, (FLAXSEED OIL) 1000 MG CAPS Take 1 capsule by mouth daily.     [name] ibuprofen (ADVIL,MOTRIN) 200 MG tablet Take 200 mg by mouth as needed.    . Multiple Vitamin (MULTIVITAMIN) tablet Take 1 tablet by mouth daily.      . nadolol (CORGARD) 40 MG tablet Take 40 mg by mouth. Take 40 mg in the morning and 20  mg in the evening - dose increase 05/20/16    . Probiotic Product (PROBIOTIC DAILY PO) Take 1 tablet by mouth daily.    [name] pyridOXINE (VITAMIN B-6) 100 MG tablet Take 100 mg by mouth daily.     No current facility-administered medications for this visit.      Past Surgical History:  Procedure Laterality Date  . BREAST BIOPSY     x2 for benign disease  . CARDIAC CATHETERIZATION    . CHOLECYSTECTOMY     (lap) 2008.  [name] COLONOSCOPY N/A 11/22/2013   Procedure: COLONOSCOPY;  Surgeon: Rogene Houston, MD;  Location: [facility];  Service: Endoscopy;  Laterality: N/A;  200  . rt knee arthroscopy       Allergies  Allergen Reactions  . Bee Venom Anaphylaxis  . Amoxicillin Hives  . Atropine     Heart race   . Doxycycline Swelling  . Band-Aid Plus Antibiotic [Bacitracin-Polymyxin B] Rash  . Latex Rash      Family History    Problem Relation Age of Onset  . Coronary artery disease Unknown        unknown     Social History Ms. Burdell reports that she has never smoked. She has never used smokeless tobacco. Ms. Stiner reports that she does not drink alcohol.   Review of Systems CONSTITUTIONAL: No weight loss, fever, chills, weakness or fatigue.  HEENT: Eyes: No visual loss, blurred vision, double vision or yellow sclerae.No hearing loss, sneezing, congestion, runny nose or sore throat.  SKIN: No rash or itching.  CARDIOVASCULAR: per hpi RESPIRATORY: No shortness of breath, cough or sputum.  GASTROINTESTINAL: No anorexia, nausea, vomiting or diarrhea. No abdominal pain or blood.  GENITOURINARY: No burning on urination, no polyuria NEUROLOGICAL: No headache, dizziness, syncope, paralysis, ataxia, numbness or tingling in the extremities. No change in bowel or bladder control.  MUSCULOSKELETAL: No muscle, back pain, joint pain or stiffness.  LYMPHATICS: No enlarged nodes. No history of splenectomy.  PSYCHIATRIC: No history of depression or anxiety.  ENDOCRINOLOGIC: No reports of sweating, cold or heat intolerance. No polyuria or polydipsia.  [name]   Physical Examination Vitals:   08/04/16 1116  BP: (!) 155/81  Pulse: 68   Vitals:   08/04/16 1116  Weight: 159 lb (72.1 kg)  Height: 5' (1.524 m)    Gen: resting comfortably, no acute distress HEENT: no scleral icterus, pupils equal round and reactive, no palptable cervical adenopathy,  CV: RRR, no m/r/g, no jvd Resp: Clear to auscultation bilaterally GI: abdomen is soft, non-tender, non-distended, normal bowel sounds, no hepatosplenomegaly MSK: extremities are warm, no edema.  Skin: warm, no rash Neuro:  no focal deficits Psych: appropriate affect   Diagnostic Studies  05/2001 cath ANGIOGRAPHIC DATA: 1. Ventriculography was performed in the RAO projection. Overall left  ventricular function was preserved and no segmental  abnormalities,  contraction were identified. Ejection fraction was calculated at 61%. I  could not appreciate significant mitral regurgitation.  2. The left main coronary artery appeared free of critical disease.  3. The left anterior descending artery coursed to the apex. There were four  small diagonal branches. The LAD as well as the other terminal branches of  the coronary artery all tapered rather quickly and were fairly small in  caliber.  4. There is a small ramus that was free of critical disease.  5. There was a circumflex that provided two marginals that appeared to be free  of significant disease. Again, the vessels terminated distally  as small  vessels.  6. The right coronary artery was a dominant vessel. There was a small acute  marginal Kayley Zeiders in terms of caliber. There was a moderate sized  posterolateral Bentley Haralson. All of these were without obvious focal narrowing.  CONCLUSIONS: 1. Preserved left ventricular function. 2. 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  Feb 25 2015 clinic EKG (performed and reviewed): NSR  Jan 2017 echo Study Conclusions  - Left ventricle: The cavity size was normal. Wall thickness was at the upper limits of normal. Systolic function was vigorous. The estimated ejection fraction was in the range of 60% to 65%. Wall motion was normal; there were no regional wall motion abnormalities. Doppler parameters are consistent with abnormal left ventricular relaxation (grade 1 diastolic dysfunction). Normal filling pressures. - Mitral valve: There was trivial regurgitation. - Right atrium: Central venous pressure (est): 3 mm Hg. - Tricuspid valve: There was trivial regurgitation. - Pulmonary arteries: PA peak pressure: 27 mm Hg (S). - Pericardium, extracardiac: There was no pericardial effusion.  Impressions:  - Upper normal LV  wall thickness with LVEF 60-65%. Grade 1 diastolic dysfunction with normal filling pressures. Trivial mitral and tricuspid regurgitation. Normal PASP 27 mmHg.  04/2016 Event monitor  Telemetry tracings show sinus rhythm with occasional PACs  Reported symptoms correlate to NSR with PACs.  No significant arrhythmias  Assessment and Plan  1. Chest pain -  previous cath with minimal disease. She has not had any recent symptoms - continue to monitor  2. Palpitations - doing well, continue current meds  3. HTN - home numbers are at goal, continue current meds  4. LE edema - improved with lower dose of norvasc.  - we will continue to follow at this time   F/u  6 months      Arnoldo Lenis, M.D.

## 2016-08-13 DIAGNOSIS — Z0000 Encounter for general adult medical examination without abnormal findings: Secondary | ICD-10-CM | POA: Diagnosis not present

## 2016-08-13 DIAGNOSIS — Z1211 Encounter for screening for malignant neoplasm of colon: Secondary | ICD-10-CM | POA: Diagnosis not present

## 2016-08-13 DIAGNOSIS — Z79899 Other long term (current) drug therapy: Secondary | ICD-10-CM | POA: Diagnosis not present

## 2016-08-13 DIAGNOSIS — F419 Anxiety disorder, unspecified: Secondary | ICD-10-CM | POA: Diagnosis not present

## 2016-08-13 DIAGNOSIS — Z6832 Body mass index (BMI) 32.0-32.9, adult: Secondary | ICD-10-CM | POA: Diagnosis not present

## 2016-08-13 DIAGNOSIS — E7800 Pure hypercholesterolemia, unspecified: Secondary | ICD-10-CM | POA: Diagnosis not present

## 2016-08-13 DIAGNOSIS — I10 Essential (primary) hypertension: Secondary | ICD-10-CM | POA: Diagnosis not present

## 2016-08-13 DIAGNOSIS — Z7189 Other specified counseling: Secondary | ICD-10-CM | POA: Diagnosis not present

## 2016-08-13 DIAGNOSIS — Z1389 Encounter for screening for other disorder: Secondary | ICD-10-CM | POA: Diagnosis not present

## 2016-08-13 DIAGNOSIS — Z1231 Encounter for screening mammogram for malignant neoplasm of breast: Secondary | ICD-10-CM | POA: Diagnosis not present

## 2016-08-13 DIAGNOSIS — Z299 Encounter for prophylactic measures, unspecified: Secondary | ICD-10-CM | POA: Diagnosis not present

## 2016-08-24 DIAGNOSIS — L570 Actinic keratosis: Secondary | ICD-10-CM | POA: Diagnosis not present

## 2016-08-24 DIAGNOSIS — L719 Rosacea, unspecified: Secondary | ICD-10-CM | POA: Diagnosis not present

## 2016-08-24 DIAGNOSIS — B078 Other viral warts: Secondary | ICD-10-CM | POA: Diagnosis not present

## 2016-08-24 DIAGNOSIS — Z85828 Personal history of other malignant neoplasm of skin: Secondary | ICD-10-CM | POA: Diagnosis not present

## 2016-09-04 DIAGNOSIS — Z1231 Encounter for screening mammogram for malignant neoplasm of breast: Secondary | ICD-10-CM | POA: Diagnosis not present

## 2016-10-28 DIAGNOSIS — I10 Essential (primary) hypertension: Secondary | ICD-10-CM | POA: Diagnosis not present

## 2016-10-28 DIAGNOSIS — M159 Polyosteoarthritis, unspecified: Secondary | ICD-10-CM | POA: Diagnosis not present

## 2016-11-12 DIAGNOSIS — Z299 Encounter for prophylactic measures, unspecified: Secondary | ICD-10-CM | POA: Diagnosis not present

## 2016-11-12 DIAGNOSIS — E669 Obesity, unspecified: Secondary | ICD-10-CM | POA: Diagnosis not present

## 2016-11-12 DIAGNOSIS — Z789 Other specified health status: Secondary | ICD-10-CM | POA: Diagnosis not present

## 2016-11-12 DIAGNOSIS — E7800 Pure hypercholesterolemia, unspecified: Secondary | ICD-10-CM | POA: Diagnosis not present

## 2016-11-12 DIAGNOSIS — I10 Essential (primary) hypertension: Secondary | ICD-10-CM | POA: Diagnosis not present

## 2016-11-12 DIAGNOSIS — F419 Anxiety disorder, unspecified: Secondary | ICD-10-CM | POA: Diagnosis not present

## 2016-11-16 ENCOUNTER — Other Ambulatory Visit: Payer: Self-pay | Admitting: Cardiology

## 2016-11-16 MED ORDER — AMLODIPINE BESYLATE 5 MG PO TABS: 5.0000 mg | ORAL_TABLET | Freq: Every day | ORAL | 1 refills | Status: DC

## 2016-11-16 NOTE — Telephone Encounter (Signed)
amLODipine (NORVASC) 5 MG tablet     [facility] in [place]

## 2016-11-16 NOTE — Telephone Encounter (Signed)
Medication sent to pharmacy  

## 2016-11-17 DIAGNOSIS — H25813 Combined forms of age-related cataract, bilateral: Secondary | ICD-10-CM | POA: Diagnosis not present

## 2016-11-17 DIAGNOSIS — H1045 Other chronic allergic conjunctivitis: Secondary | ICD-10-CM | POA: Diagnosis not present

## 2016-11-17 DIAGNOSIS — H5203 Hypermetropia, bilateral: Secondary | ICD-10-CM | POA: Diagnosis not present

## 2016-11-17 DIAGNOSIS — H52223 Regular astigmatism, bilateral: Secondary | ICD-10-CM | POA: Diagnosis not present

## 2016-11-17 DIAGNOSIS — H524 Presbyopia: Secondary | ICD-10-CM | POA: Diagnosis not present

## 2016-11-24 DIAGNOSIS — Z789 Other specified health status: Secondary | ICD-10-CM | POA: Diagnosis not present

## 2016-11-24 DIAGNOSIS — E669 Obesity, unspecified: Secondary | ICD-10-CM | POA: Diagnosis not present

## 2016-11-24 DIAGNOSIS — F419 Anxiety disorder, unspecified: Secondary | ICD-10-CM | POA: Diagnosis not present

## 2016-11-24 DIAGNOSIS — J320 Chronic maxillary sinusitis: Secondary | ICD-10-CM | POA: Diagnosis not present

## 2016-11-24 DIAGNOSIS — I10 Essential (primary) hypertension: Secondary | ICD-10-CM | POA: Diagnosis not present

## 2016-11-24 DIAGNOSIS — Z299 Encounter for prophylactic measures, unspecified: Secondary | ICD-10-CM | POA: Diagnosis not present

## 2016-11-24 DIAGNOSIS — E7800 Pure hypercholesterolemia, unspecified: Secondary | ICD-10-CM | POA: Diagnosis not present

## 2017-01-11 ENCOUNTER — Other Ambulatory Visit: Payer: Self-pay | Admitting: *Deleted

## 2017-01-11 MED ORDER — NADOLOL 40 MG PO TABS: 40.0000 mg | ORAL_TABLET | Freq: Two times a day (BID) | ORAL | 3 refills | Status: DC

## 2017-01-20 ENCOUNTER — Ambulatory Visit: Payer: Medicare Other | Admitting: Cardiology

## 2017-02-24 DIAGNOSIS — Z85828 Personal history of other malignant neoplasm of skin: Secondary | ICD-10-CM | POA: Diagnosis not present

## 2017-02-24 DIAGNOSIS — L570 Actinic keratosis: Secondary | ICD-10-CM | POA: Diagnosis not present

## 2017-02-24 DIAGNOSIS — L719 Rosacea, unspecified: Secondary | ICD-10-CM | POA: Diagnosis not present

## 2017-03-05 ENCOUNTER — Ambulatory Visit: Payer: Medicare Other | Admitting: Cardiology

## 2017-03-11 DIAGNOSIS — E7800 Pure hypercholesterolemia, unspecified: Secondary | ICD-10-CM | POA: Diagnosis not present

## 2017-03-11 DIAGNOSIS — Z299 Encounter for prophylactic measures, unspecified: Secondary | ICD-10-CM | POA: Diagnosis not present

## 2017-03-11 DIAGNOSIS — Z6832 Body mass index (BMI) 32.0-32.9, adult: Secondary | ICD-10-CM | POA: Diagnosis not present

## 2017-03-11 DIAGNOSIS — Z789 Other specified health status: Secondary | ICD-10-CM | POA: Diagnosis not present

## 2017-03-11 DIAGNOSIS — I10 Essential (primary) hypertension: Secondary | ICD-10-CM | POA: Diagnosis not present

## 2017-03-11 DIAGNOSIS — F419 Anxiety disorder, unspecified: Secondary | ICD-10-CM | POA: Diagnosis not present

## 2017-03-11 DIAGNOSIS — Z713 Dietary counseling and surveillance: Secondary | ICD-10-CM | POA: Diagnosis not present

## 2017-03-18 DIAGNOSIS — I10 Essential (primary) hypertension: Secondary | ICD-10-CM | POA: Diagnosis not present

## 2017-03-18 DIAGNOSIS — M159 Polyosteoarthritis, unspecified: Secondary | ICD-10-CM | POA: Diagnosis not present

## 2017-03-24 DIAGNOSIS — I10 Essential (primary) hypertension: Secondary | ICD-10-CM | POA: Diagnosis not present

## 2017-03-24 DIAGNOSIS — M159 Polyosteoarthritis, unspecified: Secondary | ICD-10-CM | POA: Diagnosis not present

## 2017-04-13 ENCOUNTER — Ambulatory Visit: Payer: Medicare Other | Admitting: Cardiology

## 2017-04-13 DIAGNOSIS — M1711 Unilateral primary osteoarthritis, right knee: Secondary | ICD-10-CM | POA: Diagnosis not present

## 2017-04-13 DIAGNOSIS — S83241A Other tear of medial meniscus, current injury, right knee, initial encounter: Secondary | ICD-10-CM | POA: Diagnosis not present

## 2017-04-13 DIAGNOSIS — M25561 Pain in right knee: Secondary | ICD-10-CM | POA: Diagnosis not present

## 2017-05-14 ENCOUNTER — Other Ambulatory Visit: Payer: Self-pay | Admitting: Cardiology

## 2017-05-14 MED ORDER — AMLODIPINE BESYLATE 5 MG PO TABS: 5.0000 mg | ORAL_TABLET | Freq: Every day | ORAL | 0 refills | Status: DC

## 2017-05-14 NOTE — Telephone Encounter (Signed)
Walk in   *STAT* If patient is at the pharmacy, call can be transferred to refill team.   1. Which medications need to be refilled? amLODipine (NORVASC) 5 MG tablet   2. Which pharmacy/location (including street and city if local pharmacy) is medication to be sent to? [facility]   3. Do they need a 30 day or 90 day supply?

## 2017-05-14 NOTE — Telephone Encounter (Signed)
Done

## 2017-05-25 ENCOUNTER — Other Ambulatory Visit: Payer: Self-pay

## 2017-05-25 ENCOUNTER — Ambulatory Visit (INDEPENDENT_AMBULATORY_CARE_PROVIDER_SITE_OTHER): Payer: Medicare Other | Admitting: Cardiology

## 2017-05-25 ENCOUNTER — Encounter: Payer: Self-pay | Admitting: *Deleted

## 2017-05-25 VITALS — BP 176/78 | HR 66 | Ht 60.0 in | Wt 167.0 lb

## 2017-05-25 DIAGNOSIS — R002 Palpitations: Secondary | ICD-10-CM | POA: Diagnosis not present

## 2017-05-25 DIAGNOSIS — R600 Localized edema: Secondary | ICD-10-CM

## 2017-05-25 DIAGNOSIS — I10 Essential (primary) hypertension: Secondary | ICD-10-CM | POA: Diagnosis not present

## 2017-05-25 DIAGNOSIS — R0789 Other chest pain: Secondary | ICD-10-CM

## 2017-05-25 MED ORDER — NADOLOL 40 MG PO TABS: ORAL_TABLET | ORAL | 1 refills | Status: DC

## 2017-05-25 MED ORDER — CHLORTHALIDONE 25 MG PO TABS: 25.0000 mg | ORAL_TABLET | Freq: Every day | ORAL | 1 refills | Status: DC

## 2017-05-25 MED ORDER — AMLODIPINE BESYLATE 5 MG PO TABS: 5.0000 mg | ORAL_TABLET | Freq: Every day | ORAL | 1 refills | Status: DC

## 2017-05-25 NOTE — Patient Instructions (Signed)

## 2017-05-25 NOTE — Progress Notes (Signed)
Clinical Summary Lindsay Petty is a 77 y.o.female seen today for follow up of the following medical problems.   1. History of chest pain - history of prior chest pain symptoms. Cath in 2003 without significant disease, she had small distal vessel disease  - bleeding with ASA in the past   - denies any symptoms since last visit   2. Palpitations  - 04/2016 event monitor showed symptoms correlated with SR and PACs. We increased nadolol to 40mg  in am and 20mg  in pm - no recent symptoms.   3. HTN - compliant with meds - we lowered her dose of norvasc due to LE edema, edema has improved.   - compliant with meds - she reports recent knee pain. Limiting NSAIDs. Recent steroid shots.  - mixed compliance with chlorathalidone.   4. Hyperlipidemia - statin intolerance, she is diet controlled  - has labs upcoming next week with pcp  5. LE edema - echo 02/2015 LVEF 71-24%, grade I diastolic dysfunction - improved with lower dose norvasc and on chlorthalidone.   - no recent edema.   SH: . Keeps her grandkids 9 and 78 years old. Has one great grandchild 8 months.     Past Medical History:  Diagnosis Date  . Atropine adverse reaction    atropine and sensitivity by history  . Chest pain    cardiac catheterization 2003, no major epicardial disease, small distal vessels I  . Ejection fraction    65%.Marland Kitchenecho April 2003  . GERD (gastroesophageal reflux disease)   . Hx of cholecystectomy 03/2006   lap   . Hyperlipemia   . Hypertension    renal artery ultrasound, January, 2011, no significant renal artery stenosis,  ... medications make her feel tired  . Jaw pain    etiology uncertain  . Palpitations   . Statin intolerance    muscle weakness  . Vertigo    Mild positional vertigo     Allergies  Allergen Reactions  . Bee Venom Anaphylaxis  . Amoxicillin Hives  . Atropine     Heart race   . Doxycycline Swelling  . Band-Aid Plus Antibiotic  [Bacitracin-Polymyxin B] Rash  . Latex Rash     Current Outpatient Medications  Medication Sig Dispense Refill  . ALPRAZolam (XANAX) 0.5 MG tablet Take 0.5 mg by mouth 2 (two) times daily as needed for anxiety.     [name] amLODipine (NORVASC) 5 MG tablet Take 1 tablet (5 mg total) by mouth daily. 90 tablet 0  . calcium carbonate (TUMS - DOSED IN MG ELEMENTAL CALCIUM) 500 MG chewable tablet Chew 1 tablet by mouth daily as needed.     . chlorthalidone (HYGROTON) 25 MG tablet Take 25 mg by mouth daily.    . Cholecalciferol (VITAMIN D3) 1000 UNITS CAPS Take 1 capsule by mouth daily.     . Coenzyme Q10 (COQ10) 100 MG CAPS Take 1 capsule by mouth daily.      [name] esomeprazole (NEXIUM) 20 MG capsule Take 20 mg by mouth daily.     . Flaxseed, Linseed, (FLAXSEED OIL) 1000 MG CAPS Take 1 capsule by mouth daily.     . Magnesium 200 MG TABS Take 200 mg by mouth 2 (two) times daily.    . Multiple Vitamin (MULTIVITAMIN) tablet Take 1 tablet by mouth daily.      . nadolol (CORGARD) 40 MG tablet Take 1 tablet (40 mg total) by mouth 2 (two) times daily. Take 40 mg in the morning and 20  mg in the evening - dose increase 05/20/16 45 tablet 3  . naproxen sodium (ANAPROX) 220 MG tablet Take 220 mg by mouth as needed.    . Probiotic Product (PROBIOTIC DAILY PO) Take 1 tablet by mouth daily.    [name] pyridOXINE (VITAMIN B-6) 100 MG tablet Take 100 mg by mouth daily.     No current facility-administered medications for this visit.      Past Surgical History:  Procedure Laterality Date  . BREAST BIOPSY     x2 for benign disease  . CARDIAC CATHETERIZATION    . CHOLECYSTECTOMY     (lap) 2008.  [name] COLONOSCOPY N/A 11/22/2013   Procedure: COLONOSCOPY;  Surgeon: Rogene Houston, MD;  Location: [facility];  Service: Endoscopy;  Laterality: N/A;  200  . rt knee arthroscopy       Allergies  Allergen Reactions  . Bee Venom Anaphylaxis  . Amoxicillin Hives  . Atropine     Heart race   . Doxycycline Swelling  . Band-Aid  Plus Antibiotic [Bacitracin-Polymyxin B] Rash  . Latex Rash      Family History  Problem Relation Age of Onset  . Coronary artery disease Unknown        unknown     Social History Lindsay Petty reports that she has never smoked. She has never used smokeless tobacco. Lindsay Petty reports that she does not drink alcohol.   Review of Systems CONSTITUTIONAL: No weight loss, fever, chills, weakness or fatigue.  HEENT: Eyes: No visual loss, blurred vision, double vision or yellow sclerae.No hearing loss, sneezing, congestion, runny nose or sore throat.  SKIN: No rash or itching.  CARDIOVASCULAR: per hpi RESPIRATORY: No shortness of breath, cough or sputum.  GASTROINTESTINAL: No anorexia, nausea, vomiting or diarrhea. No abdominal pain or blood.  GENITOURINARY: No burning on urination, no polyuria NEUROLOGICAL: No headache, dizziness, syncope, paralysis, ataxia, numbness or tingling in the extremities. No change in bowel or bladder control.  MUSCULOSKELETAL: No muscle, back pain, joint pain or stiffness.  LYMPHATICS: No enlarged nodes. No history of splenectomy.  PSYCHIATRIC: No history of depression or anxiety.  ENDOCRINOLOGIC: No reports of sweating, cold or heat intolerance. No polyuria or polydipsia.  [name]   Physical Examination Vitals:   05/25/17 1424  BP: (!) 176/78  Pulse: 66  SpO2: 98%   Vitals:   05/25/17 1424  Weight: 167 lb (75.8 kg)  Height: 5' (1.524 m)    Gen: resting comfortably, no acute distress HEENT: no scleral icterus, pupils equal round and reactive, no palptable cervical adenopathy,  CV: RRR, no m/r/g, no jvd Resp: Clear to auscultation bilaterally GI: abdomen is soft, non-tender, non-distended, normal bowel sounds, no hepatosplenomegaly MSK: extremities are warm, no edema.  Skin: warm, no rash Neuro:  no focal deficits Psych: appropriate affect   Diagnostic Studies  05/2001 cath ANGIOGRAPHIC DATA: 1. Ventriculography was performed in the  RAO projection. Overall left  ventricular function was preserved and no segmental abnormalities,  contraction were identified. Ejection fraction was calculated at 61%. I  could not appreciate significant mitral regurgitation.  2. The left main coronary artery appeared free of critical disease.  3. The left anterior descending artery coursed to the apex. There were four  small diagonal branches. The LAD as well as the other terminal branches of  the coronary artery all tapered rather quickly and were fairly small in  caliber.  4. There is a small ramus that was free of critical disease.  5. There was  a circumflex that provided two marginals that appeared to be free  of significant disease. Again, the vessels terminated distally as small  vessels.  6. The right coronary artery was a dominant vessel. There was a small acute  marginal Bailey Kolbe in terms of caliber. There was a moderate sized  posterolateral Jinelle Butchko. All of these were without obvious focal narrowing.  CONCLUSIONS: 1. Preserved left ventricular function. 2. 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  Feb 25 2015 clinic EKG (performed and reviewed): NSR  Jan 2017 echo Study Conclusions  - Left ventricle: The cavity size was normal. Wall thickness was at the upper limits of normal. Systolic function was vigorous. The estimated ejection fraction was in the range of 60% to 65%. Wall motion was normal; there were no regional wall motion abnormalities. Doppler parameters are consistent with abnormal left ventricular relaxation (grade 1 diastolic dysfunction). Normal filling pressures. - Mitral valve: There was trivial regurgitation. - Right atrium: Central venous pressure (est): 3 mm Hg. - Tricuspid valve: There was trivial regurgitation. - Pulmonary arteries: PA peak pressure: 27 mm Hg (S). - Pericardium,  extracardiac: There was no pericardial effusion.  Impressions:  - Upper normal LV wall thickness with LVEF 60-65%. Grade 1 diastolic dysfunction with normal filling pressures. Trivial mitral and tricuspid regurgitation. Normal PASP 27 mmHg.  04/2016 Event monitor  Telemetry tracings show sinus rhythm with occasional PACs  Reported symptoms correlate to NSR with PACs.  No significant arrhythmias    Assessment and Plan  1. Chest pain - previous cath with minimal disease. - no significant recent symptoms, continue to monitor.   2. Palpitations - no symptoms, continue current meds - EKG today shows NSR>   3. HTN - elevated in clinic. She reports recent knee pain with NSAID use and steroid injections that may be affecting. She also stopped her chlorthalidone - resume chlorthalidone.   4. LE edema - improved with lower dose of norvasc.  - continue to monitor.        Arnoldo Lenis, M.D.

## 2017-05-29 ENCOUNTER — Encounter: Payer: Self-pay | Admitting: Cardiology

## 2017-05-31 DIAGNOSIS — M25552 Pain in left hip: Secondary | ICD-10-CM | POA: Diagnosis not present

## 2017-05-31 DIAGNOSIS — M25561 Pain in right knee: Secondary | ICD-10-CM | POA: Diagnosis not present

## 2017-05-31 DIAGNOSIS — S76012D Strain of muscle, fascia and tendon of left hip, subsequent encounter: Secondary | ICD-10-CM | POA: Diagnosis not present

## 2017-06-10 DIAGNOSIS — E7800 Pure hypercholesterolemia, unspecified: Secondary | ICD-10-CM | POA: Diagnosis not present

## 2017-06-10 DIAGNOSIS — F419 Anxiety disorder, unspecified: Secondary | ICD-10-CM | POA: Diagnosis not present

## 2017-06-10 DIAGNOSIS — I10 Essential (primary) hypertension: Secondary | ICD-10-CM | POA: Diagnosis not present

## 2017-06-10 DIAGNOSIS — Z6832 Body mass index (BMI) 32.0-32.9, adult: Secondary | ICD-10-CM | POA: Diagnosis not present

## 2017-06-10 DIAGNOSIS — Z299 Encounter for prophylactic measures, unspecified: Secondary | ICD-10-CM | POA: Diagnosis not present

## 2017-06-24 DIAGNOSIS — I10 Essential (primary) hypertension: Secondary | ICD-10-CM | POA: Diagnosis not present

## 2017-06-24 DIAGNOSIS — Z299 Encounter for prophylactic measures, unspecified: Secondary | ICD-10-CM | POA: Diagnosis not present

## 2017-06-24 DIAGNOSIS — Z713 Dietary counseling and surveillance: Secondary | ICD-10-CM | POA: Diagnosis not present

## 2017-06-24 DIAGNOSIS — Z6832 Body mass index (BMI) 32.0-32.9, adult: Secondary | ICD-10-CM | POA: Diagnosis not present

## 2017-09-09 DIAGNOSIS — I10 Essential (primary) hypertension: Secondary | ICD-10-CM | POA: Diagnosis not present

## 2017-09-09 DIAGNOSIS — M159 Polyosteoarthritis, unspecified: Secondary | ICD-10-CM | POA: Diagnosis not present

## 2017-09-13 DIAGNOSIS — Z7189 Other specified counseling: Secondary | ICD-10-CM | POA: Diagnosis not present

## 2017-09-13 DIAGNOSIS — R5383 Other fatigue: Secondary | ICD-10-CM | POA: Diagnosis not present

## 2017-09-13 DIAGNOSIS — Z1211 Encounter for screening for malignant neoplasm of colon: Secondary | ICD-10-CM | POA: Diagnosis not present

## 2017-09-13 DIAGNOSIS — Z1339 Encounter for screening examination for other mental health and behavioral disorders: Secondary | ICD-10-CM | POA: Diagnosis not present

## 2017-09-13 DIAGNOSIS — Z0000 Encounter for general adult medical examination without abnormal findings: Secondary | ICD-10-CM | POA: Diagnosis not present

## 2017-09-13 DIAGNOSIS — Z299 Encounter for prophylactic measures, unspecified: Secondary | ICD-10-CM | POA: Diagnosis not present

## 2017-09-13 DIAGNOSIS — I10 Essential (primary) hypertension: Secondary | ICD-10-CM | POA: Diagnosis not present

## 2017-09-13 DIAGNOSIS — Z1331 Encounter for screening for depression: Secondary | ICD-10-CM | POA: Diagnosis not present

## 2017-09-13 DIAGNOSIS — Z6833 Body mass index (BMI) 33.0-33.9, adult: Secondary | ICD-10-CM | POA: Diagnosis not present

## 2017-09-13 DIAGNOSIS — Z79899 Other long term (current) drug therapy: Secondary | ICD-10-CM | POA: Diagnosis not present

## 2017-09-13 DIAGNOSIS — E7800 Pure hypercholesterolemia, unspecified: Secondary | ICD-10-CM | POA: Diagnosis not present

## 2017-09-20 DIAGNOSIS — M722 Plantar fascial fibromatosis: Secondary | ICD-10-CM | POA: Diagnosis not present

## 2017-09-20 DIAGNOSIS — M79671 Pain in right foot: Secondary | ICD-10-CM | POA: Diagnosis not present

## 2017-09-20 DIAGNOSIS — M25579 Pain in unspecified ankle and joints of unspecified foot: Secondary | ICD-10-CM | POA: Diagnosis not present

## 2017-10-11 DIAGNOSIS — M79672 Pain in left foot: Secondary | ICD-10-CM | POA: Diagnosis not present

## 2017-10-11 DIAGNOSIS — M722 Plantar fascial fibromatosis: Secondary | ICD-10-CM | POA: Diagnosis not present

## 2017-10-11 DIAGNOSIS — M25579 Pain in unspecified ankle and joints of unspecified foot: Secondary | ICD-10-CM | POA: Diagnosis not present

## 2017-10-21 DIAGNOSIS — M159 Polyosteoarthritis, unspecified: Secondary | ICD-10-CM | POA: Diagnosis not present

## 2017-10-21 DIAGNOSIS — I10 Essential (primary) hypertension: Secondary | ICD-10-CM | POA: Diagnosis not present

## 2017-11-19 ENCOUNTER — Encounter: Payer: Self-pay | Admitting: *Deleted

## 2017-11-22 ENCOUNTER — Ambulatory Visit: Payer: Medicare Other | Admitting: Cardiology

## 2017-11-30 DIAGNOSIS — H5203 Hypermetropia, bilateral: Secondary | ICD-10-CM | POA: Diagnosis not present

## 2017-11-30 DIAGNOSIS — H524 Presbyopia: Secondary | ICD-10-CM | POA: Diagnosis not present

## 2017-11-30 DIAGNOSIS — H25813 Combined forms of age-related cataract, bilateral: Secondary | ICD-10-CM | POA: Diagnosis not present

## 2017-11-30 DIAGNOSIS — H1045 Other chronic allergic conjunctivitis: Secondary | ICD-10-CM | POA: Diagnosis not present

## 2017-11-30 DIAGNOSIS — H52223 Regular astigmatism, bilateral: Secondary | ICD-10-CM | POA: Diagnosis not present

## 2017-12-06 DIAGNOSIS — M79672 Pain in left foot: Secondary | ICD-10-CM | POA: Diagnosis not present

## 2017-12-06 DIAGNOSIS — M79671 Pain in right foot: Secondary | ICD-10-CM | POA: Diagnosis not present

## 2017-12-06 DIAGNOSIS — M25579 Pain in unspecified ankle and joints of unspecified foot: Secondary | ICD-10-CM | POA: Diagnosis not present

## 2017-12-14 DIAGNOSIS — Z299 Encounter for prophylactic measures, unspecified: Secondary | ICD-10-CM | POA: Diagnosis not present

## 2017-12-14 DIAGNOSIS — M25519 Pain in unspecified shoulder: Secondary | ICD-10-CM | POA: Diagnosis not present

## 2017-12-14 DIAGNOSIS — Z6833 Body mass index (BMI) 33.0-33.9, adult: Secondary | ICD-10-CM | POA: Diagnosis not present

## 2017-12-14 DIAGNOSIS — I10 Essential (primary) hypertension: Secondary | ICD-10-CM | POA: Diagnosis not present

## 2017-12-15 DIAGNOSIS — M25519 Pain in unspecified shoulder: Secondary | ICD-10-CM | POA: Diagnosis not present

## 2017-12-15 DIAGNOSIS — F419 Anxiety disorder, unspecified: Secondary | ICD-10-CM | POA: Diagnosis not present

## 2017-12-15 DIAGNOSIS — I10 Essential (primary) hypertension: Secondary | ICD-10-CM | POA: Diagnosis not present

## 2017-12-15 DIAGNOSIS — M25511 Pain in right shoulder: Secondary | ICD-10-CM | POA: Diagnosis not present

## 2017-12-15 DIAGNOSIS — Z6833 Body mass index (BMI) 33.0-33.9, adult: Secondary | ICD-10-CM | POA: Diagnosis not present

## 2017-12-15 DIAGNOSIS — E7800 Pure hypercholesterolemia, unspecified: Secondary | ICD-10-CM | POA: Diagnosis not present

## 2017-12-15 DIAGNOSIS — Z299 Encounter for prophylactic measures, unspecified: Secondary | ICD-10-CM | POA: Diagnosis not present

## 2018-01-25 ENCOUNTER — Encounter: Payer: Self-pay | Admitting: *Deleted

## 2018-01-26 ENCOUNTER — Encounter: Payer: Self-pay | Admitting: Cardiology

## 2018-01-26 ENCOUNTER — Encounter: Payer: Self-pay | Admitting: *Deleted

## 2018-01-26 ENCOUNTER — Ambulatory Visit (INDEPENDENT_AMBULATORY_CARE_PROVIDER_SITE_OTHER): Payer: Medicare Other | Admitting: Cardiology

## 2018-01-26 VITALS — BP 154/85 | HR 67 | Ht 60.0 in | Wt 166.8 lb

## 2018-01-26 DIAGNOSIS — R0789 Other chest pain: Secondary | ICD-10-CM

## 2018-01-26 DIAGNOSIS — R002 Palpitations: Secondary | ICD-10-CM

## 2018-01-26 DIAGNOSIS — I10 Essential (primary) hypertension: Secondary | ICD-10-CM

## 2018-01-26 NOTE — Progress Notes (Signed)
Clinical Summary Ms. Tholl is a 77 y.o.female seen today for follow up of the following medical problems.   1. History of chest pain - history of prior chest pain symptoms. Cath in 2003 without significant disease, she had small distal vessel disease  - bleeding with ASA in the past   -no recent symptoms   2. Palpitations - 04/2016 event monitor showed symptoms correlated with SR and PACs. We increased nadolol to 40mg  in am and 20mg  in pm   - occasoinal palpitations, often brought on with anxiety. Better with xanax.  - no caffeine, no EtOH  3. HTN - we lowered her dose of norvasc due to LE edema, edema has improved.  - compliant with meds - reports ongoign issues with joint pains, on home NSAIDs with intermittent injections that may be playing a role.    4. Hyperlipidemia - statin intolerance, she is diet controlled - last labs with pcp  5. LE edema - echo 02/2015 LVEF 46-80%, grade I diastolic dysfunction - improved with lower dose norvasc and on chlorthalidone.   - no recent edema since lowering norvasc and starting chlorthalidone    SH: [name] Keeps her grandkids 20 and 78 years old.Has one great grandchild 8 months.   Past Medical History:  Diagnosis Date  . Atropine adverse reaction    atropine and sensitivity by history  . Chest pain    cardiac catheterization 2003, no major epicardial disease, small distal vessels I  . Ejection fraction    65%.Marland Kitchenecho April 2003  . GERD (gastroesophageal reflux disease)   . Hx of cholecystectomy 03/2006   lap   . Hyperlipemia   . Hypertension    renal artery ultrasound, January, 2011, no significant renal artery stenosis,  ... medications make her feel tired  . Jaw pain    etiology uncertain  . Palpitations   . Statin intolerance    muscle weakness  . Vertigo    Mild positional vertigo     Allergies  Allergen Reactions  . Bee Venom Anaphylaxis  . Amoxicillin Hives  . Atropine     Heart race     . Doxycycline Swelling  . Band-Aid Plus Antibiotic [Bacitracin-Polymyxin B] Rash  . Latex Rash     Current Outpatient Medications  Medication Sig Dispense Refill  . ALPRAZolam (XANAX) 0.5 MG tablet Take 0.5 mg by mouth 2 (two) times daily as needed for anxiety.     [name] amLODipine (NORVASC) 5 MG tablet Take 1 tablet (5 mg total) by mouth daily. 90 tablet 1  . calcium carbonate (TUMS - DOSED IN MG ELEMENTAL CALCIUM) 500 MG chewable tablet Chew 1 tablet by mouth daily as needed.     . chlorthalidone (HYGROTON) 25 MG tablet Take 1 tablet (25 mg total) by mouth daily. 90 tablet 1  . Cholecalciferol (VITAMIN D3) 1000 UNITS CAPS Take 1 capsule by mouth daily.     . Coenzyme Q10 (COQ10) 100 MG CAPS Take 1 capsule by mouth daily.      [name] esomeprazole (NEXIUM) 20 MG capsule Take 20 mg by mouth daily.     . Flaxseed, Linseed, (FLAXSEED OIL) 1000 MG CAPS Take 1 capsule by mouth daily.     . Magnesium 200 MG TABS Take 200 mg by mouth 2 (two) times daily.    . Multiple Vitamin (MULTIVITAMIN) tablet Take 1 tablet by mouth daily.      . nadolol (CORGARD) 40 MG tablet Take 40 mg in the morning and 20  mg in the evening - dose increase 05/20/16 135 tablet 1  . naproxen sodium (ANAPROX) 220 MG tablet Take 220 mg by mouth as needed.    . Probiotic Product (PROBIOTIC DAILY PO) Take 1 tablet by mouth daily.    [name] pyridOXINE (VITAMIN B-6) 100 MG tablet Take 100 mg by mouth daily.     No current facility-administered medications for this visit.      Past Surgical History:  Procedure Laterality Date  . BREAST BIOPSY     x2 for benign disease  . CARDIAC CATHETERIZATION    . CHOLECYSTECTOMY     (lap) 2008.  [name] COLONOSCOPY N/A 11/22/2013   Procedure: COLONOSCOPY;  Surgeon: Rogene Houston, MD;  Location: [facility];  Service: Endoscopy;  Laterality: N/A;  200  . rt knee arthroscopy       Allergies  Allergen Reactions  . Bee Venom Anaphylaxis  . Amoxicillin Hives  . Atropine     Heart race   .  Doxycycline Swelling  . Band-Aid Plus Antibiotic [Bacitracin-Polymyxin B] Rash  . Latex Rash      Family History  Problem Relation Age of Onset  . Coronary artery disease Unknown        unknown     Social History Ms. Audia reports that she has never smoked. She has never used smokeless tobacco. Ms. Dome reports that she does not drink alcohol.   Review of Systems CONSTITUTIONAL: No weight loss, fever, chills, weakness or fatigue.  HEENT: Eyes: No visual loss, blurred vision, double vision or yellow sclerae.No hearing loss, sneezing, congestion, runny nose or sore throat.  SKIN: No rash or itching.  CARDIOVASCULAR: per hpi RESPIRATORY: No shortness of breath, cough or sputum.  GASTROINTESTINAL: No anorexia, nausea, vomiting or diarrhea. No abdominal pain or blood.  GENITOURINARY: No burning on urination, no polyuria NEUROLOGICAL: No headache, dizziness, syncope, paralysis, ataxia, numbness or tingling in the extremities. No change in bowel or bladder control.  MUSCULOSKELETAL: +shoulder pain LYMPHATICS: No enlarged nodes. No history of splenectomy.  PSYCHIATRIC: No history of depression or anxiety.  ENDOCRINOLOGIC: No reports of sweating, cold or heat intolerance. No polyuria or polydipsia.  [name]   Physical Examination Vitals:   01/26/18 1117  BP: (!) 154/85  Pulse: 67  SpO2: 97%   Vitals:   01/26/18 1117  Weight: 166 lb 12.8 oz (75.7 kg)  Height: 5' (1.524 m)    Gen: resting comfortably, no acute distress HEENT: no scleral icterus, pupils equal round and reactive, no palptable cervical adenopathy,  CV: RRR, no m/r/g, no jvd Resp: Clear to auscultation bilaterally GI: abdomen is soft, non-tender, non-distended, normal bowel sounds, no hepatosplenomegaly MSK: extremities are warm, no edema.  Skin: warm, no rash Neuro:  no focal deficits Psych: appropriate affect   Diagnostic Studies 05/2001 cath ANGIOGRAPHIC DATA: 1. Ventriculography was performed in  the RAO projection. Overall left  ventricular function was preserved and no segmental abnormalities,  contraction were identified. Ejection fraction was calculated at 61%. I  could not appreciate significant mitral regurgitation.  2. The left main coronary artery appeared free of critical disease.  3. The left anterior descending artery coursed to the apex. There were four  small diagonal branches. The LAD as well as the other terminal branches of  the coronary artery all tapered rather quickly and were fairly small in  caliber.  4. There is a small ramus that was free of critical disease.  5. There was a circumflex that provided two marginals  that appeared to be free  of significant disease. Again, the vessels terminated distally as small  vessels.  6. The right coronary artery was a dominant vessel. There was a small acute  marginal Raya Mckinstry in terms of caliber. There was a moderate sized  posterolateral Jaiyon Wander. All of these were without obvious focal narrowing.  CONCLUSIONS: 1. Preserved left ventricular function. 2. 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  Feb 25 2015 clinic EKG (performed and reviewed): NSR  Jan 2017 echo Study Conclusions  - Left ventricle: The cavity size was normal. Wall thickness was at the upper limits of normal. Systolic function was vigorous. The estimated ejection fraction was in the range of 60% to 65%. Wall motion was normal; there were no regional wall motion abnormalities. Doppler parameters are consistent with abnormal left ventricular relaxation (grade 1 diastolic dysfunction). Normal filling pressures. - Mitral valve: There was trivial regurgitation. - Right atrium: Central venous pressure (est): 3 mm Hg. - Tricuspid valve: There was trivial regurgitation. - Pulmonary arteries: PA peak pressure: 27 mm Hg (S). -  Pericardium, extracardiac: There was no pericardial effusion.  Impressions:  - Upper normal LV wall thickness with LVEF 60-65%. Grade 1 diastolic dysfunction with normal filling pressures. Trivial mitral and tricuspid regurgitation. Normal PASP 27 mmHg.  04/2016 Event monitor  Telemetry tracings show sinus rhythm with occasional PACs  Reported symptoms correlate to NSR with PACs.  No significant arrhythmias      Assessment and Plan  1. Chest pain - previous cath with minimal disease. - no recent symptoms, continue to monitor.   2. Palpitations - mild symptoms mainly stress related, better with prn benzos - continue current nadolol dosing.   3. HTN -above goal, she will keep bp log x 1 week, pending trend would adjust meds. If above goal likely would start lisinopril 10mg  daily.    F/u 6 months     Arnoldo Lenis, M.D.

## 2018-01-26 NOTE — Patient Instructions (Addendum)
Medication Instructions:  Continue all current medications.  Labwork: none  Testing/Procedures: none  Follow-Up: Your physician wants you to follow up in: 6 months.  You will receive a reminder letter in the mail one-two months in advance.  If you don't receive a letter, please call our office to schedule the follow up appointment   Any Other Special Instructions Will Be Listed Below (If Applicable). Your physician has requested that you regularly monitor and record your blood pressure readings at home for 1 week.  Please take readings approximately 2 hours after your medication.  Return log to office for MD review.    If you need a refill on your cardiac medications before your next appointment, please call your pharmacy.

## 2018-02-14 ENCOUNTER — Other Ambulatory Visit: Payer: Self-pay | Admitting: Cardiology

## 2018-03-03 DIAGNOSIS — M25511 Pain in right shoulder: Secondary | ICD-10-CM | POA: Diagnosis not present

## 2018-03-03 DIAGNOSIS — G8929 Other chronic pain: Secondary | ICD-10-CM | POA: Diagnosis not present

## 2018-03-10 DIAGNOSIS — S46011A Strain of muscle(s) and tendon(s) of the rotator cuff of right shoulder, initial encounter: Secondary | ICD-10-CM | POA: Diagnosis not present

## 2018-03-10 DIAGNOSIS — M19011 Primary osteoarthritis, right shoulder: Secondary | ICD-10-CM | POA: Diagnosis not present

## 2018-03-10 DIAGNOSIS — M25411 Effusion, right shoulder: Secondary | ICD-10-CM | POA: Diagnosis not present

## 2018-03-10 DIAGNOSIS — M24111 Other articular cartilage disorders, right shoulder: Secondary | ICD-10-CM | POA: Diagnosis not present

## 2018-03-16 DIAGNOSIS — E7800 Pure hypercholesterolemia, unspecified: Secondary | ICD-10-CM | POA: Diagnosis not present

## 2018-03-16 DIAGNOSIS — I10 Essential (primary) hypertension: Secondary | ICD-10-CM | POA: Diagnosis not present

## 2018-03-16 DIAGNOSIS — Z6833 Body mass index (BMI) 33.0-33.9, adult: Secondary | ICD-10-CM | POA: Diagnosis not present

## 2018-03-16 DIAGNOSIS — Z299 Encounter for prophylactic measures, unspecified: Secondary | ICD-10-CM | POA: Diagnosis not present

## 2018-03-16 DIAGNOSIS — F419 Anxiety disorder, unspecified: Secondary | ICD-10-CM | POA: Diagnosis not present

## 2018-03-16 DIAGNOSIS — Z789 Other specified health status: Secondary | ICD-10-CM | POA: Diagnosis not present

## 2018-03-17 DIAGNOSIS — S46011A Strain of muscle(s) and tendon(s) of the rotator cuff of right shoulder, initial encounter: Secondary | ICD-10-CM | POA: Diagnosis not present

## 2018-03-18 ENCOUNTER — Telehealth: Payer: Self-pay | Admitting: *Deleted

## 2018-03-18 DIAGNOSIS — E7800 Pure hypercholesterolemia, unspecified: Secondary | ICD-10-CM | POA: Diagnosis not present

## 2018-03-18 DIAGNOSIS — F419 Anxiety disorder, unspecified: Secondary | ICD-10-CM | POA: Diagnosis not present

## 2018-03-18 DIAGNOSIS — Z299 Encounter for prophylactic measures, unspecified: Secondary | ICD-10-CM | POA: Diagnosis not present

## 2018-03-18 DIAGNOSIS — Z6833 Body mass index (BMI) 33.0-33.9, adult: Secondary | ICD-10-CM | POA: Diagnosis not present

## 2018-03-18 DIAGNOSIS — I10 Essential (primary) hypertension: Secondary | ICD-10-CM | POA: Diagnosis not present

## 2018-03-18 DIAGNOSIS — E876 Hypokalemia: Secondary | ICD-10-CM | POA: Diagnosis not present

## 2018-03-18 NOTE — Telephone Encounter (Signed)
Per LOV pt dropped of BP log - routed to provider   02/11/18 126/78 HR 70 02/14/18 137/78 HR 69 02/24/18   133/73 HR 64 02/28/18   126/80 HR 72 03/06/18   126/78 HR 69 03/15/18   121/75 HR 78

## 2018-03-21 ENCOUNTER — Encounter: Payer: Self-pay | Admitting: *Deleted

## 2018-03-21 NOTE — Telephone Encounter (Signed)
Pt voiced understanding

## 2018-03-21 NOTE — Telephone Encounter (Signed)
I would continue chlorthalidone, if the potassium becomes a recurring issue we may have to consider an alternative   Zandra Abts MD

## 2018-03-21 NOTE — Telephone Encounter (Signed)
Pt aware says she had labs done at pcp (will request) and potassium was low - pcp gave her potassium 10 meq for 5 days and will have labs rechecked later this week - wanted to know if she should continue chlorthalidone

## 2018-03-21 NOTE — Telephone Encounter (Signed)
BP's are ok, no changes   J Lisabeth Mian MD

## 2018-03-25 DIAGNOSIS — E876 Hypokalemia: Secondary | ICD-10-CM | POA: Diagnosis not present

## 2018-04-04 DIAGNOSIS — E2839 Other primary ovarian failure: Secondary | ICD-10-CM | POA: Diagnosis not present

## 2018-04-04 DIAGNOSIS — M8580 Other specified disorders of bone density and structure, unspecified site: Secondary | ICD-10-CM | POA: Diagnosis not present

## 2018-04-06 DIAGNOSIS — C44329 Squamous cell carcinoma of skin of other parts of face: Secondary | ICD-10-CM | POA: Diagnosis not present

## 2018-04-06 DIAGNOSIS — L738 Other specified follicular disorders: Secondary | ICD-10-CM | POA: Diagnosis not present

## 2018-04-06 DIAGNOSIS — L718 Other rosacea: Secondary | ICD-10-CM | POA: Diagnosis not present

## 2018-04-06 DIAGNOSIS — D485 Neoplasm of uncertain behavior of skin: Secondary | ICD-10-CM | POA: Diagnosis not present

## 2018-04-18 DIAGNOSIS — C44329 Squamous cell carcinoma of skin of other parts of face: Secondary | ICD-10-CM | POA: Diagnosis not present

## 2018-04-26 ENCOUNTER — Other Ambulatory Visit: Payer: Self-pay | Admitting: Cardiology

## 2018-04-28 DIAGNOSIS — Z85828 Personal history of other malignant neoplasm of skin: Secondary | ICD-10-CM | POA: Diagnosis not present

## 2018-04-28 DIAGNOSIS — D225 Melanocytic nevi of trunk: Secondary | ICD-10-CM | POA: Diagnosis not present

## 2018-04-28 DIAGNOSIS — L570 Actinic keratosis: Secondary | ICD-10-CM | POA: Diagnosis not present

## 2018-04-28 DIAGNOSIS — L821 Other seborrheic keratosis: Secondary | ICD-10-CM | POA: Diagnosis not present

## 2018-05-09 DIAGNOSIS — M25531 Pain in right wrist: Secondary | ICD-10-CM | POA: Diagnosis not present

## 2018-05-09 DIAGNOSIS — M79641 Pain in right hand: Secondary | ICD-10-CM | POA: Diagnosis not present

## 2018-07-07 DIAGNOSIS — M19031 Primary osteoarthritis, right wrist: Secondary | ICD-10-CM | POA: Diagnosis not present

## 2018-07-07 DIAGNOSIS — S63501S Unspecified sprain of right wrist, sequela: Secondary | ICD-10-CM | POA: Diagnosis not present

## 2018-07-07 DIAGNOSIS — M1811 Unilateral primary osteoarthritis of first carpometacarpal joint, right hand: Secondary | ICD-10-CM | POA: Diagnosis not present

## 2018-08-17 DIAGNOSIS — M25641 Stiffness of right hand, not elsewhere classified: Secondary | ICD-10-CM | POA: Diagnosis not present

## 2018-08-18 DIAGNOSIS — M150 Primary generalized (osteo)arthritis: Secondary | ICD-10-CM | POA: Diagnosis not present

## 2018-08-18 DIAGNOSIS — M6281 Muscle weakness (generalized): Secondary | ICD-10-CM | POA: Diagnosis not present

## 2018-08-18 DIAGNOSIS — M25641 Stiffness of right hand, not elsewhere classified: Secondary | ICD-10-CM | POA: Diagnosis not present

## 2018-08-18 DIAGNOSIS — M25541 Pain in joints of right hand: Secondary | ICD-10-CM | POA: Diagnosis not present

## 2018-09-14 DIAGNOSIS — S63502A Unspecified sprain of left wrist, initial encounter: Secondary | ICD-10-CM | POA: Diagnosis not present

## 2018-09-15 DIAGNOSIS — Z79899 Other long term (current) drug therapy: Secondary | ICD-10-CM | POA: Diagnosis not present

## 2018-09-15 DIAGNOSIS — R5383 Other fatigue: Secondary | ICD-10-CM | POA: Diagnosis not present

## 2018-09-15 DIAGNOSIS — R946 Abnormal results of thyroid function studies: Secondary | ICD-10-CM | POA: Diagnosis not present

## 2018-09-15 DIAGNOSIS — Z6833 Body mass index (BMI) 33.0-33.9, adult: Secondary | ICD-10-CM | POA: Diagnosis not present

## 2018-09-15 DIAGNOSIS — E559 Vitamin D deficiency, unspecified: Secondary | ICD-10-CM | POA: Diagnosis not present

## 2018-09-15 DIAGNOSIS — Z713 Dietary counseling and surveillance: Secondary | ICD-10-CM | POA: Diagnosis not present

## 2018-09-15 DIAGNOSIS — Z0000 Encounter for general adult medical examination without abnormal findings: Secondary | ICD-10-CM | POA: Diagnosis not present

## 2018-09-15 DIAGNOSIS — E7800 Pure hypercholesterolemia, unspecified: Secondary | ICD-10-CM | POA: Diagnosis not present

## 2018-09-15 DIAGNOSIS — Z1211 Encounter for screening for malignant neoplasm of colon: Secondary | ICD-10-CM | POA: Diagnosis not present

## 2018-09-15 DIAGNOSIS — I10 Essential (primary) hypertension: Secondary | ICD-10-CM | POA: Diagnosis not present

## 2018-09-15 DIAGNOSIS — Z7189 Other specified counseling: Secondary | ICD-10-CM | POA: Diagnosis not present

## 2018-09-15 DIAGNOSIS — Z299 Encounter for prophylactic measures, unspecified: Secondary | ICD-10-CM | POA: Diagnosis not present

## 2018-09-15 DIAGNOSIS — Z1339 Encounter for screening examination for other mental health and behavioral disorders: Secondary | ICD-10-CM | POA: Diagnosis not present

## 2018-09-15 DIAGNOSIS — Z1331 Encounter for screening for depression: Secondary | ICD-10-CM | POA: Diagnosis not present

## 2018-09-16 ENCOUNTER — Encounter: Payer: Self-pay | Admitting: Cardiology

## 2018-09-22 ENCOUNTER — Telehealth: Payer: Self-pay | Admitting: Cardiology

## 2018-09-22 NOTE — Telephone Encounter (Signed)
Virtual Visit Pre-Appointment Phone Call  "(Name), I am calling you today to discuss your upcoming appointment. We are currently trying to limit exposure to the virus that causes COVID-19 by seeing patients at home rather than in the office."  1. "What is the BEST phone number to call the day of the visit?" - include this in appointment notes  2. Do you have or have access to (through a family member/friend) a smartphone with video capability that we can use for your visit?" a. If yes - list this number in appt notes as cell (if different from BEST phone #) and list the appointment type as a VIDEO visit in appointment notes b. If no - list the appointment type as a PHONE visit in appointment notes  Confirm consent - "In the setting of the current Covid19 crisis, you are scheduled for a (phone or video) visit with your provider on (date) at (time).  Just as we do with many in-office visits, in order for you to participate in this visit, we must obtain consent.  If you'd like, I can send this to your mychart (if signed up) or email for you to review.  Otherwise, I can obtain your verbal consent now.  All virtual visits are billed to your insurance company just like a normal visit would be.  By agreeing to a virtual visit, we'd like you to understand that the technology does not allow for your provider to perform an examination, and thus may limit your provider's ability to fully assess your condition. If your provider identifies any concerns that need to be evaluated in person, we will make arrangements to do so.  Finally, though the technology is pretty good, we cannot assure that it will always work on either your or our end, and in the setting of a video visit, we may have to convert it to a phone-only visit.  In either situation, we cannot ensure that we have a secure connection.  Are you willing to proceed?" STAFF: Did the patient verbally acknowledge consent to telehealth visit? Document  YES/NO here: yes 3. Advise patient to be prepared - "Two hours prior to your appointment, go ahead and check your blood pressure, pulse, oxygen saturation, and your weight (if you have the equipment to check those) and write them all down. When your visit starts, your provider will ask you for this information. If you have an Apple Watch or Kardia device, please plan to have heart rate information ready on the day of your appointment. Please have a pen and paper handy nearby the day of the visit as well."  4. Give patient instructions for MyChart download to smartphone OR [email or web address] as below if video visit (depending on what platform provider is using)  5. Inform patient they will receive a phone call 15 minutes prior to their appointment time (may be from unknown caller ID) so they should be prepared to answer    TELEPHONE CALL NOTE  Lindsay Petty has been deemed a candidate for a follow-up tele-health visit to limit community exposure during the Covid-19 pandemic. I spoke with the patient via phone to ensure availability of phone/video source, confirm preferred email & phone number, and discuss instructions and expectations.  I reminded Lindsay Petty to be prepared with any vital sign and/or heart rhythm information that could potentially be obtained via home monitoring, at the time of her visit. I reminded Lindsay Petty to expect a phone call prior to her visit.  Weston Anna 09/22/2018 1:26 PM   INSTRUCTIONS FOR DOWNLOADING THE MYCHART APP TO SMARTPHONE  - The patient must first make sure to have activated MyChart and know their login information - If Apple, go to [employer] and type in MyChart in the search bar and download the app. If Android, ask patient to go to [employer] and type in [place] in the search bar and download the app. The app is free but as with any other app downloads, their phone may require them to verify saved payment  information or Apple/Android password.  - The patient will need to then log into the app with their MyChart username and password, and select [facility] as their healthcare provider to link the account. When it is time for your visit, go to the MyChart app, find appointments, and click Begin Video Visit. Be sure to Select Allow for your device to access the Microphone and Camera for your visit. You will then be connected, and your provider will be with you shortly.  **If they have any issues connecting, or need assistance please contact MyChart service desk [phone])**  **If using a computer, in order to ensure the best quality for their visit they will need to use either of the following Internet Browsers: [employer], or Google Chrome**  IF USING DOXIMITY or DOXY.ME - The patient will receive a link just prior to their visit by text.     FULL LENGTH CONSENT FOR TELE-HEALTH VISIT   I hereby voluntarily request, consent and authorize [facility] and its employed or contracted physicians, physician assistants, nurse practitioners or other licensed health care professionals (the Practitioner), to provide me with telemedicine health care services (the Services") as deemed necessary by the treating Practitioner. I acknowledge and consent to receive the Services by the Practitioner via telemedicine. I understand that the telemedicine visit will involve communicating with the Practitioner through live audiovisual communication technology and the disclosure of certain medical information by electronic transmission. I acknowledge that I have been given the opportunity to request an in-person assessment or other available alternative prior to the telemedicine visit and am voluntarily participating in the telemedicine visit.  I understand that I have the right to withhold or withdraw my consent to the use of telemedicine in the course of my care at any time, without affecting my right  to future care or treatment, and that the Practitioner or I may terminate the telemedicine visit at any time. I understand that I have the right to inspect all information obtained and/or recorded in the course of the telemedicine visit and may receive copies of available information for a reasonable fee.  I understand that some of the potential risks of receiving the Services via telemedicine include:   Delay or interruption in medical evaluation due to technological equipment failure or disruption;  Information transmitted may not be sufficient (e.g. poor resolution of images) to allow for appropriate medical decision making by the Practitioner; and/or   In rare instances, security protocols could fail, causing a breach of personal health information.  Furthermore, I acknowledge that it is my responsibility to provide information about my medical history, conditions and care that is complete and accurate to the best of my ability. I acknowledge that Practitioner's advice, recommendations, and/or decision may be based on factors not within their control, such as incomplete or inaccurate data provided by me or distortions of diagnostic images or specimens that may result from electronic transmissions. I understand that the  practice of medicine is not an Chief Strategy Officer and that Practitioner makes no warranties or guarantees regarding treatment outcomes. I acknowledge that I will receive a copy of this consent concurrently upon execution via email to the email address I last provided but may also request a printed copy by calling the office of [facility].    I understand that my insurance will be billed for this visit.   I have read or had this consent read to me.  I understand the contents of this consent, which adequately explains the benefits and risks of the Services being provided via telemedicine.   I have been provided ample opportunity to ask questions regarding this consent and the Services  and have had my questions answered to my satisfaction.  I give my informed consent for the services to be provided through the use of telemedicine in my medical care  By participating in this telemedicine visit I agree to the above.

## 2018-09-28 ENCOUNTER — Encounter: Payer: Self-pay | Admitting: *Deleted

## 2018-09-28 ENCOUNTER — Telehealth (INDEPENDENT_AMBULATORY_CARE_PROVIDER_SITE_OTHER): Payer: Medicare Other | Admitting: Cardiology

## 2018-09-28 VITALS — BP 128/74 | HR 65 | Ht 59.0 in | Wt 165.0 lb

## 2018-09-28 DIAGNOSIS — R002 Palpitations: Secondary | ICD-10-CM

## 2018-09-28 DIAGNOSIS — R0789 Other chest pain: Secondary | ICD-10-CM

## 2018-09-28 DIAGNOSIS — I10 Essential (primary) hypertension: Secondary | ICD-10-CM

## 2018-09-28 DIAGNOSIS — E876 Hypokalemia: Secondary | ICD-10-CM

## 2018-09-28 DIAGNOSIS — R600 Localized edema: Secondary | ICD-10-CM

## 2018-09-28 MED ORDER — POTASSIUM CHLORIDE CRYS ER 20 MEQ PO TBCR: 20.0000 meq | EXTENDED_RELEASE_TABLET | Freq: Every day | ORAL | 6 refills | Status: DC

## 2018-09-28 NOTE — Progress Notes (Signed)
Virtual Visit via Telephone Note   This visit type was conducted due to national recommendations for restrictions regarding the COVID-19 Pandemic (e.g. social distancing) in an effort to limit this patient's exposure and mitigate transmission in our community.  Due to her co-morbid illnesses, this patient is at least at moderate risk for complications without adequate follow up.  This format is felt to be most appropriate for this patient at this time.  The patient did not have access to video technology/had technical difficulties with video requiring transitioning to audio format only (telephone).  All issues noted in this document were discussed and addressed.  No physical exam could be performed with this format.  Please refer to the patient's chart for her  consent to telehealth for [facility].   Date:  09/28/2018   ID:  Lindsay Petty, DOB 1940/08/11, MRN [id number]  Patient Location: Home Provider Location: Home  PCP:  Berenice Primas  Cardiologist:  Carlyle Dolly, MD  Electrophysiologist:  None   Evaluation Performed:  Follow-Up Visit  Chief Complaint:  Follow up visit  History of Present Illness:    Lindsay Petty is a 78 y.o. female seen today for follow up of the following medical problems.   1. History of chest pain - history of prior chest pain symptoms. Cath in 2003 without significant disease, she had small distal vessel disease  - bleeding with ASA in the past   -denies any recent chest pain.    2. Palpitations - 04/2016 event monitor showed symptoms correlated with SR and PACs. We increased nadolol to 40mg  in am and 20mg  in pm   - some palpitations at times, up and down. Better with xanax. No caffeine, no EtoH  3. HTN - we lowered her dose of norvasc due to LE edema, edema has improved.    - compliant with meds. Has had some issues with low K on chlorthalidone.    4. Hyperlipidemia - statin intolerance, she is diet  controlled - labs followed by pcp, she reports she had labs last month  5. LE edema - echo 02/2015 LVEF 44-03%, grade I diastolic dysfunction - improved with lower dose norvasc and on chlorthalidone.   - no recent swelling. Did have some when she took some NSAIDs recently but resolved when she stopped taking.     SH: . Keeps her grandkids 38 and 77 years old.Has one great grandchild 8 months  The patient does not have symptoms concerning for COVID-19 infection (fever, chills, cough, or new shortness of breath).    Past Medical History:  Diagnosis Date  . Atropine adverse reaction    atropine and sensitivity by history  . Chest pain    cardiac catheterization 2003, no major epicardial disease, small distal vessels I  . Ejection fraction    65%.Marland Kitchenecho April 2003  . GERD (gastroesophageal reflux disease)   . Hx of cholecystectomy 03/2006   lap   . Hyperlipemia   . Hypertension    renal artery ultrasound, January, 2011, no significant renal artery stenosis,  ... medications make her feel tired  . Jaw pain    etiology uncertain  . Palpitations   . Statin intolerance    muscle weakness  . Vertigo    Mild positional vertigo   Past Surgical History:  Procedure Laterality Date  . BREAST BIOPSY     x2 for benign disease  . CARDIAC CATHETERIZATION    . CHOLECYSTECTOMY     (lap) 2008.  [name] COLONOSCOPY  N/A 11/22/2013   Procedure: COLONOSCOPY;  Surgeon: Rogene Houston, MD;  [facility];  Service: Endoscopy;  Laterality: N/A;  200  . rt knee arthroscopy       No outpatient medications have been marked as taking for the 09/28/18 encounter (Appointment) with Arnoldo Lenis, MD.     Allergies:   Bee venom, Amoxicillin, Atropine, Doxycycline, Band-aid plus antibiotic [bacitracin-polymyxin b], and Latex   Social History   Tobacco Use  . Smoking status: Never Smoker  . Smokeless tobacco: Never Used  Substance Use Topics  . Alcohol use: No    Alcohol/week: 0.0  standard drinks  . Drug use: No     Family Hx: The patient's family history includes Coronary artery disease in an other family member.  ROS:   Please see the history of present illness.     All other systems reviewed and are negative.   Prior CV studies:   The following studies were reviewed today:  05/2001 cath ANGIOGRAPHIC DATA: 1. Ventriculography was performed in the RAO projection. Overall left  ventricular function was preserved and no segmental abnormalities,  contraction were identified. Ejection fraction was calculated at 61%. I  could not appreciate significant mitral regurgitation.  2. The left main coronary artery appeared free of critical disease.  3. The left anterior descending artery coursed to the apex. There were four  small diagonal branches. The LAD as well as the other terminal branches of  the coronary artery all tapered rather quickly and were fairly small in  caliber.  4. There is a small ramus that was free of critical disease.  5. There was a circumflex that provided two marginals that appeared to be free  of significant disease. Again, the vessels terminated distally as small  vessels.  6. The right coronary artery was a dominant vessel. There was a small acute  marginal Miachel Nardelli in terms of caliber. There was a moderate sized  posterolateral Marilea Gwynne. All of these were without obvious focal narrowing.  CONCLUSIONS: 1. Preserved left ventricular function. 2. 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  Feb 25 2015 clinic EKG (performed and reviewed): NSR  Jan 2017 echo Study Conclusions  - Left ventricle: The cavity size was normal. Wall thickness was at the upper limits of normal. Systolic function was vigorous. The estimated ejection fraction was in the range of 60% to 65%. Wall motion was normal; there were no regional wall motion  abnormalities. Doppler parameters are consistent with abnormal left ventricular relaxation (grade 1 diastolic dysfunction). Normal filling pressures. - Mitral valve: There was trivial regurgitation. - Right atrium: Central venous pressure (est): 3 mm Hg. - Tricuspid valve: There was trivial regurgitation. - Pulmonary arteries: PA peak pressure: 27 mm Hg (S). - Pericardium, extracardiac: There was no pericardial effusion.  Impressions:  - Upper normal LV wall thickness with LVEF 60-65%. Grade 1 diastolic dysfunction with normal filling pressures. Trivial mitral and tricuspid regurgitation. Normal PASP 27 mmHg.  04/2016 Event monitor  Telemetry tracings show sinus rhythm with occasional PACs  Reported symptoms correlate to NSR with PACs.  No significant arrhythmias  Labs/Other Tests and Data Reviewed:    EKG:  No ECG reviewed.  Recent Labs: No results found for requested labs within last 8760 hours.   Recent Lipid Panel No results found for: CHOL, TRIG, HDL, CHOLHDL, LDLCALC, LDLDIRECT  Wt Readings from Last 3 Encounters:  01/26/18 166 lb 12.8 oz (75.7 kg)  05/25/17  167 lb (75.8 kg)  08/04/16 159 lb (72.1 kg)     Objective:    Vital Signs:   Today's Vitals   09/28/18 1233  BP: 128/74  Pulse: 65  Weight: 165 lb (74.8 kg)  Height: 4\' 11"  (1.499 m)   Body mass index is 33.33 kg/m.  Normal affect. Normal speech pattern and tone. Comfortable, no apparent distress. No audible signs of SOb or wheezing.   ASSESSMENT & PLAN:    1. Chest pain - previous cath with minimal disease. - denies any recent symptoms, continue current meds  2. Palpitations -mild symptoms mainly stress related, better with prn benzos - she will continue current therapy  3. HTN -at goal, continue current meds   4. Hypokalemia - start KCl 65mEq daily, check BMET and Mg in 2 weeks.  Likely secondary to chlorthalidone, has done well from bp and swelling on this med, would  not change at this time.   5. LE edema - controlled, continue diuretic    F/u 6 months  COVID-19 Education: The signs and symptoms of COVID-19 were discussed with the patient and how to seek care for testing (follow up with PCP or arrange E-visit).  The importance of social distancing was discussed today.  Time:   Today, I have spent 22 minutes with the patient with telehealth technology discussing the above problems.     Medication Adjustments/Labs and Tests Ordered: Current medicines are reviewed at length with the patient today.  Concerns regarding medicines are outlined above.   Tests Ordered: No orders of the defined types were placed in this encounter.   Medication Changes: No orders of the defined types were placed in this encounter.   Follow Up:  In Person in 6 month(s)  Signed, Carlyle Dolly, MD  09/28/2018 12:03 PM    [place]

## 2018-09-28 NOTE — Patient Instructions (Signed)
Medication Instructions:   Begin Potassium 4meq daily.   New prescription sent to pharmacy today.   Continue all other medications.    Labwork:  BMET, Magnesium - orders enclosed.   Please do in approximately 2 weeks.   Office will contact with results via phone or letter.    Testing/Procedures: none  Follow-Up: Your physician wants you to follow up in: 6 months.  You will receive a reminder letter in the mail one-two months in advance.  If you don't receive a letter, please call our office to schedule the follow up appointment   Any Other Special Instructions Will Be Listed Below (If Applicable).  If you need a refill on your cardiac medications before your next appointment, please call your pharmacy.

## 2018-09-28 NOTE — Addendum Note (Signed)
Addended by: Laurine Blazer on: 09/28/2018 02:28 PM   Modules accepted: Orders

## 2018-10-17 DIAGNOSIS — I10 Essential (primary) hypertension: Secondary | ICD-10-CM | POA: Diagnosis not present

## 2018-10-17 DIAGNOSIS — E876 Hypokalemia: Secondary | ICD-10-CM | POA: Diagnosis not present

## 2018-10-17 DIAGNOSIS — R002 Palpitations: Secondary | ICD-10-CM | POA: Diagnosis not present

## 2018-10-17 LAB — BASIC METABOLIC PANEL
BUN/Creatinine Ratio: 17 (calc) (ref 6–22)
BUN: 18 mg/dL (ref 7–25)
CO2: 33 mmol/L — ABNORMAL HIGH (ref 20–32)
Calcium: 9.9 mg/dL (ref 8.6–10.4)
Chloride: 102 mmol/L (ref 98–110)
Creat: 1.04 mg/dL — ABNORMAL HIGH (ref 0.60–0.88)
Glucose, Bld: 114 mg/dL — ABNORMAL HIGH (ref 65–99)
Potassium: 3.8 mmol/L (ref 3.5–5.3)
Sodium: 142 mmol/L (ref 135–146)

## 2018-10-17 LAB — MAGNESIUM: Magnesium: 1.8 mg/dL (ref 1.5–2.5)

## 2018-10-21 ENCOUNTER — Other Ambulatory Visit: Payer: Self-pay | Admitting: *Deleted

## 2018-10-21 ENCOUNTER — Telehealth: Payer: Self-pay | Admitting: Cardiology

## 2018-10-21 MED ORDER — POTASSIUM CHLORIDE CRYS ER 20 MEQ PO TBCR: 20.0000 meq | EXTENDED_RELEASE_TABLET | Freq: Every day | ORAL | 1 refills | Status: DC

## 2018-10-21 NOTE — Telephone Encounter (Signed)
Labs look good including her potassium levelt. Verify she has been taking KCl 33mEq daily and if so continue    Lindsay Abts MD   Pt confirmed she is taking potassium 20 meq and requested refills - routed to pcp

## 2018-10-21 NOTE — Telephone Encounter (Signed)
Asking for blood work results

## 2018-10-21 NOTE — Telephone Encounter (Signed)
Lab scanned in to chart.

## 2018-10-25 ENCOUNTER — Other Ambulatory Visit: Payer: Self-pay | Admitting: Cardiology

## 2018-10-26 DIAGNOSIS — M722 Plantar fascial fibromatosis: Secondary | ICD-10-CM | POA: Diagnosis not present

## 2018-10-26 DIAGNOSIS — M79672 Pain in left foot: Secondary | ICD-10-CM | POA: Diagnosis not present

## 2018-11-15 DIAGNOSIS — M84374A Stress fracture, right foot, initial encounter for fracture: Secondary | ICD-10-CM | POA: Diagnosis not present

## 2018-11-15 DIAGNOSIS — M79671 Pain in right foot: Secondary | ICD-10-CM | POA: Diagnosis not present

## 2018-11-18 ENCOUNTER — Encounter (INDEPENDENT_AMBULATORY_CARE_PROVIDER_SITE_OTHER): Payer: Self-pay | Admitting: *Deleted

## 2018-11-29 DIAGNOSIS — M84374A Stress fracture, right foot, initial encounter for fracture: Secondary | ICD-10-CM | POA: Diagnosis not present

## 2018-11-29 DIAGNOSIS — M79671 Pain in right foot: Secondary | ICD-10-CM | POA: Diagnosis not present

## 2018-12-13 DIAGNOSIS — M79671 Pain in right foot: Secondary | ICD-10-CM | POA: Diagnosis not present

## 2018-12-13 DIAGNOSIS — M84374A Stress fracture, right foot, initial encounter for fracture: Secondary | ICD-10-CM | POA: Diagnosis not present

## 2018-12-13 DIAGNOSIS — Z23 Encounter for immunization: Secondary | ICD-10-CM | POA: Diagnosis not present

## 2018-12-21 DIAGNOSIS — Z2821 Immunization not carried out because of patient refusal: Secondary | ICD-10-CM | POA: Diagnosis not present

## 2018-12-21 DIAGNOSIS — Z299 Encounter for prophylactic measures, unspecified: Secondary | ICD-10-CM | POA: Diagnosis not present

## 2018-12-21 DIAGNOSIS — I10 Essential (primary) hypertension: Secondary | ICD-10-CM | POA: Diagnosis not present

## 2018-12-21 DIAGNOSIS — R109 Unspecified abdominal pain: Secondary | ICD-10-CM | POA: Diagnosis not present

## 2018-12-21 DIAGNOSIS — Z6832 Body mass index (BMI) 32.0-32.9, adult: Secondary | ICD-10-CM | POA: Diagnosis not present

## 2018-12-21 DIAGNOSIS — R739 Hyperglycemia, unspecified: Secondary | ICD-10-CM | POA: Diagnosis not present

## 2018-12-26 DIAGNOSIS — D259 Leiomyoma of uterus, unspecified: Secondary | ICD-10-CM | POA: Diagnosis not present

## 2018-12-26 DIAGNOSIS — I700 Atherosclerosis of aorta: Secondary | ICD-10-CM | POA: Diagnosis not present

## 2018-12-26 DIAGNOSIS — N8110 Cystocele, unspecified: Secondary | ICD-10-CM | POA: Diagnosis not present

## 2018-12-26 DIAGNOSIS — R1032 Left lower quadrant pain: Secondary | ICD-10-CM | POA: Diagnosis not present

## 2018-12-26 DIAGNOSIS — K760 Fatty (change of) liver, not elsewhere classified: Secondary | ICD-10-CM | POA: Diagnosis not present

## 2018-12-26 DIAGNOSIS — D171 Benign lipomatous neoplasm of skin and subcutaneous tissue of trunk: Secondary | ICD-10-CM | POA: Diagnosis not present

## 2018-12-29 DIAGNOSIS — I10 Essential (primary) hypertension: Secondary | ICD-10-CM | POA: Diagnosis not present

## 2018-12-29 DIAGNOSIS — N8110 Cystocele, unspecified: Secondary | ICD-10-CM | POA: Diagnosis not present

## 2018-12-29 DIAGNOSIS — D219 Benign neoplasm of connective and other soft tissue, unspecified: Secondary | ICD-10-CM | POA: Diagnosis not present

## 2018-12-29 DIAGNOSIS — Z713 Dietary counseling and surveillance: Secondary | ICD-10-CM | POA: Diagnosis not present

## 2018-12-29 DIAGNOSIS — Z299 Encounter for prophylactic measures, unspecified: Secondary | ICD-10-CM | POA: Diagnosis not present

## 2019-01-16 ENCOUNTER — Other Ambulatory Visit: Payer: Self-pay | Admitting: Cardiology

## 2019-01-24 DIAGNOSIS — M79671 Pain in right foot: Secondary | ICD-10-CM | POA: Diagnosis not present

## 2019-01-24 DIAGNOSIS — M76822 Posterior tibial tendinitis, left leg: Secondary | ICD-10-CM | POA: Diagnosis not present

## 2019-01-24 DIAGNOSIS — M79672 Pain in left foot: Secondary | ICD-10-CM | POA: Diagnosis not present

## 2019-01-24 DIAGNOSIS — M84374D Stress fracture, right foot, subsequent encounter for fracture with routine healing: Secondary | ICD-10-CM | POA: Diagnosis not present

## 2019-03-23 DIAGNOSIS — I10 Essential (primary) hypertension: Secondary | ICD-10-CM | POA: Diagnosis not present

## 2019-04-04 ENCOUNTER — Encounter: Payer: Self-pay | Admitting: Cardiology

## 2019-04-04 ENCOUNTER — Telehealth (INDEPENDENT_AMBULATORY_CARE_PROVIDER_SITE_OTHER): Payer: Medicare Other | Admitting: Cardiology

## 2019-04-04 ENCOUNTER — Encounter: Payer: Self-pay | Admitting: *Deleted

## 2019-04-04 VITALS — BP 134/72 | HR 64 | Ht 60.0 in | Wt 151.0 lb

## 2019-04-04 DIAGNOSIS — I10 Essential (primary) hypertension: Secondary | ICD-10-CM | POA: Diagnosis not present

## 2019-04-04 DIAGNOSIS — R600 Localized edema: Secondary | ICD-10-CM

## 2019-04-04 MED ORDER — NADOLOL 40 MG PO TABS: ORAL_TABLET | ORAL | 2 refills | Status: DC

## 2019-04-04 MED ORDER — AMLODIPINE BESYLATE 5 MG PO TABS: 5.0000 mg | ORAL_TABLET | Freq: Every day | ORAL | 2 refills | Status: DC

## 2019-04-04 MED ORDER — FUROSEMIDE 40 MG PO TABS: 40.0000 mg | ORAL_TABLET | Freq: Every day | ORAL | 1 refills | Status: DC | PRN

## 2019-04-04 NOTE — Progress Notes (Signed)
Virtual Visit via Telephone Note   This visit type was conducted due to national recommendations for restrictions regarding the COVID-19 Pandemic (e.g. social distancing) in an effort to limit this patient's exposure and mitigate transmission in our community.  Due to her co-morbid illnesses, this patient is at least at moderate risk for complications without adequate follow up.  This format is felt to be most appropriate for this patient at this time.  The patient did not have access to video technology/had technical difficulties with video requiring transitioning to audio format only (telephone).  All issues noted in this document were discussed and addressed.  No physical exam could be performed with this format.  Please refer to the patient's chart for her  consent to telehealth for [facility].   Date:  04/04/2019   ID:  Lindsay Petty, DOB [birth year], MRN [id number]  Patient Location: Home Provider Location: Office  PCP:  Berenice Primas  Cardiologist:  Carlyle Dolly, MD  Electrophysiologist:  None   Evaluation Performed:  Follow-Up Visit  Chief Complaint:  Follow up  History of Present Illness:    Lindsay Petty is a 79 y.o. female seen today for follow up of the following medical problems.   1. History of chest pain - history of prior chest pain symptoms. Cath in 2003 without significant disease, she had small distal vessel disease  - bleeding with ASA in the past   -- no recent chest pain.    2. Palpitations - 04/2016 event monitor showed symptoms correlated with SR and PACs. We increased nadolol to 40mg  in am and 20mg  in pm   - rare palpiations since last visit  3. HTN - we lowered her dose of norvasc due to LE edema, edema has improved.    - compliant with meds. Has had some issues with low K on chlorthalidone.  - she also has some concern about her renal function based on most recent labs and chlorthalidone.    4.  Hyperlipidemia - statin intolerance, she is diet controlled - labs followed by pcp  5. LE edema - echo 02/2015 LVEF 123456, grade I diastolic dysfunction - improved with lower dose norvasc and on chlorthalidone.   - no recent issues    SH: [name] Keeps her grandkids 38 and 48 years old.Has one great grandchild 8 months     The patient does not have symptoms concerning for COVID-19 infection (fever, chills, cough, or new shortness of breath).    Past Medical History:  Diagnosis Date  . Atropine adverse reaction    atropine and sensitivity by history  . Chest pain    cardiac catheterization 2003, no major epicardial disease, small distal vessels I  . Ejection fraction    65%.Marland Kitchenecho April 2003  . GERD (gastroesophageal reflux disease)   . Hx of cholecystectomy 03/2006   lap   . Hyperlipemia   . Hypertension    renal artery ultrasound, January, 2011, no significant renal artery stenosis,  ... medications make her feel tired  . Jaw pain    etiology uncertain  . Palpitations   . Statin intolerance    muscle weakness  . Vertigo    Mild positional vertigo   Past Surgical History:  Procedure Laterality Date  . BREAST BIOPSY     x2 for benign disease  . CARDIAC CATHETERIZATION    . CHOLECYSTECTOMY     (lap) 2008.  [name] COLONOSCOPY N/A 11/22/2013   Procedure: COLONOSCOPY;  Surgeon: Rogene Houston, MD;  Location: [facility];  Service: Endoscopy;  Laterality: N/A;  200  . rt knee arthroscopy       Current Meds  Medication Sig  . ALPRAZolam (XANAX) 0.5 MG tablet Take 0.5 mg by mouth 2 (two) times daily as needed for anxiety.   [name] amLODipine (NORVASC) 5 MG tablet TAKE 1 TABLET BY MOUTH ONCE DAILY.  [name] Ascorbic Acid (VITAMIN C) 1000 MG tablet Take 1,000 mg by mouth daily.  . calcium carbonate (TUMS - DOSED IN MG ELEMENTAL CALCIUM) 500 MG chewable tablet Chew 1 tablet by mouth daily as needed.   . chlorthalidone (HYGROTON) 25 MG tablet TAKE 1 TABLET BY MOUTH ONCE DAILY.  [name]  Cholecalciferol (VITAMIN D3) 1000 UNITS CAPS Take 1 capsule by mouth daily.   . Coenzyme Q10 (COQ10) 100 MG CAPS Take 1 capsule by mouth daily.    [name] esomeprazole (NEXIUM) 20 MG capsule Take 20 mg by mouth daily.   . Flaxseed, Linseed, (FLAXSEED OIL) 1000 MG CAPS Take 1 capsule by mouth daily.   . Magnesium 200 MG TABS Take 200 mg by mouth daily.   . Multiple Vitamin (MULTIVITAMIN) tablet Take 1 tablet by mouth daily.    . nadolol (CORGARD) 40 MG tablet TAKE 1 TABLET IN THE MORNING AND 1/2 TABLET IN THE EVENING.  [name] potassium chloride SA (K-DUR) 20 MEQ tablet Take 1 tablet (20 mEq total) by mouth daily.  [name] pyridOXINE (VITAMIN B-6) 100 MG tablet Take 100 mg by mouth daily.  [name] zinc gluconate 50 MG tablet Take 50 mg by mouth daily.     Allergies:   Bee venom, Amoxicillin, Atropine, Doxycycline, Band-aid plus antibiotic [bacitracin-polymyxin b], and Latex   Social History   Tobacco Use  . Smoking status: Never Smoker  . Smokeless tobacco: Never Used  Substance Use Topics  . Alcohol use: No    Alcohol/week: 0.0 standard drinks  . Drug use: No     Family Hx: The patient's family history includes Coronary artery disease in an other family member.  ROS:   Please see the history of present illness.     All other systems reviewed and are negative.   Prior CV studies:   The following studies were reviewed today:  05/2001 cath ANGIOGRAPHIC DATA: 1. Ventriculography was performed in the RAO projection. Overall left  ventricular function was preserved and no segmental abnormalities,  contraction were identified. Ejection fraction was calculated at 61%. I  could not appreciate significant mitral regurgitation.  2. The left main coronary artery appeared free of critical disease.  3. The left anterior descending artery coursed to the apex. There were four  small diagonal branches. The LAD as well as the other terminal branches of  the coronary artery all tapered rather  quickly and were fairly small in  caliber.  4. There is a small ramus that was free of critical disease.  5. There was a circumflex that provided two marginals that appeared to be free  of significant disease. Again, the vessels terminated distally as small  vessels.  6. The right coronary artery was a dominant vessel. There was a small acute  marginal Ovie Eastep in terms of caliber. There was a moderate sized  posterolateral Fransisca Shawn. All of these were without obvious focal narrowing.  CONCLUSIONS: 1. Preserved left ventricular function. 2. 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  Feb 25 2015 clinic EKG (performed and reviewed): NSR  Jan 2017 echo Study Conclusions  -  Left ventricle: The cavity size was normal. Wall thickness was at the upper limits of normal. Systolic function was vigorous. The estimated ejection fraction was in the range of 60% to 65%. Wall motion was normal; there were no regional wall motion abnormalities. Doppler parameters are consistent with abnormal left ventricular relaxation (grade 1 diastolic dysfunction). Normal filling pressures. - Mitral valve: There was trivial regurgitation. - Right atrium: Central venous pressure (est): 3 mm Hg. - Tricuspid valve: There was trivial regurgitation. - Pulmonary arteries: PA peak pressure: 27 mm Hg (S). - Pericardium, extracardiac: There was no pericardial effusion.  Impressions:  - Upper normal LV wall thickness with LVEF 60-65%. Grade 1 diastolic dysfunction with normal filling pressures. Trivial mitral and tricuspid regurgitation. Normal PASP 27 mmHg.  04/2016 Event monitor  Telemetry tracings show sinus rhythm with occasional PACs  Reported symptoms correlate to NSR with PACs.  No significant arrhythmias  Labs/Other Tests and Data Reviewed:    EKG:  No ECG reviewed.  Recent Labs: 10/17/2018: BUN  18; Creat 1.04; Magnesium 1.8; Potassium 3.8; Sodium 142   Recent Lipid Panel No results found for: CHOL, TRIG, HDL, CHOLHDL, LDLCALC, LDLDIRECT  Wt Readings from Last 3 Encounters:  04/04/19 151 lb (68.5 kg)  09/28/18 165 lb (74.8 kg)  01/26/18 166 lb 12.8 oz (75.7 kg)     Objective:    Vital Signs:  BP 134/72   Pulse 64   Ht 5' (1.524 m)   Wt 151 lb (68.5 kg)   BMI 29.49 kg/m    Normal affect. Normal speech pattern and tone. Comfortable, no apparent distress. No audible signs of SOB or wheezing.   ASSESSMENT & PLAN:    1. Chest pain - previous cath with minimal disease. - no recent symptoms, continue to monitor.   2. Palpitations -mild symptoms mainly stress related, better with prn benzos -overall rare symptoms, continue to monitor  3. HTN -she favors stopping chlorthalidone based on recent renal function labs, I think reasonable to d/c at this time and monitor bp's and labs    4. LE edema - change chlorthalidone to just prn lasix   COVID-19 Education: The signs and symptoms of COVID-19 were discussed with the patient and how to seek care for testing (follow up with PCP or arrange E-visit).  The importance of social distancing was discussed today.  Time:   Today, I have spent 22 minutes with the patient with telehealth technology discussing the above problems.     Medication Adjustments/Labs and Tests Ordered: Current medicines are reviewed at length with the patient today.  Concerns regarding medicines are outlined above.   Tests Ordered: No orders of the defined types were placed in this encounter.   Medication Changes: No orders of the defined types were placed in this encounter.   Follow Up:  Either In Person or Virtual in 6 month(s)  Signed, Carlyle Dolly, MD  04/04/2019 1:27 PM    [facility]

## 2019-04-04 NOTE — Patient Instructions (Addendum)
Medication Instructions:   Your physician has recommended you make the following change in your medication:   Stop chlorthalidone  Start furosemide 40 mg by mouth daily as needed for swelling  Continue other medications the same  Labwork:  Your physician recommends that you return for non-fasting lab work in: 2 weeks to check your BMET & Mg levels. Your lab orders have been faxed to [facility] in [place].   Testing/Procedures:   NONE  Follow-Up:  Your physician recommends that you schedule a follow-up appointment in: 6 months (office or virtual). You will receive a reminder letter in the mail in about 4 months reminding you to call and schedule your appointment. If you don't receive this letter, please contact our office.  Any Other Special Instructions Will Be Listed Below (If Applicable).  If you need a refill on your cardiac medications before your next appointment, please call your pharmacy.

## 2019-04-18 DIAGNOSIS — I10 Essential (primary) hypertension: Secondary | ICD-10-CM | POA: Diagnosis not present

## 2019-04-18 DIAGNOSIS — R600 Localized edema: Secondary | ICD-10-CM | POA: Diagnosis not present

## 2019-04-19 LAB — MAGNESIUM: Magnesium: 2 mg/dL (ref 1.5–2.5)

## 2019-04-19 LAB — BASIC METABOLIC PANEL
BUN/Creatinine Ratio: 17 (calc) (ref 6–22)
BUN: 17 mg/dL (ref 7–25)
CO2: 28 mmol/L (ref 20–32)
Calcium: 9.8 mg/dL (ref 8.6–10.4)
Chloride: 106 mmol/L (ref 98–110)
Creat: 1 mg/dL — ABNORMAL HIGH (ref 0.60–0.88)
Glucose, Bld: 110 mg/dL (ref 65–139)
Potassium: 4.2 mmol/L (ref 3.5–5.3)
Sodium: 142 mmol/L (ref 135–146)

## 2019-05-05 ENCOUNTER — Telehealth: Payer: Self-pay | Admitting: *Deleted

## 2019-05-05 NOTE — Telephone Encounter (Signed)
-----   Message from Arnoldo Lenis, MD sent at 04/28/2019 11:36 AM EST ----- Labs look good, kidney function just slightly below normal but better compared to last check and overall looks fine.  Zandra Abts MD

## 2019-05-05 NOTE — Telephone Encounter (Signed)
Pt voiced understanding - routed to pcp  

## 2019-06-12 ENCOUNTER — Other Ambulatory Visit: Payer: Self-pay | Admitting: Cardiology

## 2019-06-15 ENCOUNTER — Observation Stay (HOSPITAL_COMMUNITY): Payer: Medicare Other

## 2019-06-15 ENCOUNTER — Observation Stay (HOSPITAL_COMMUNITY)
Admission: EM | Admit: 2019-06-15 | Discharge: 2019-06-16 | Disposition: A | Discharge status: Home / Self Care | Payer: Medicare Other | Attending: Family Medicine | Admitting: Family Medicine

## 2019-06-15 ENCOUNTER — Emergency Department (HOSPITAL_COMMUNITY): Payer: Medicare Other

## 2019-06-15 ENCOUNTER — Encounter (HOSPITAL_COMMUNITY): Payer: Self-pay

## 2019-06-15 ENCOUNTER — Other Ambulatory Visit: Payer: Self-pay

## 2019-06-15 DIAGNOSIS — Z79899 Other long term (current) drug therapy: Secondary | ICD-10-CM | POA: Insufficient documentation

## 2019-06-15 DIAGNOSIS — Z91040 Latex allergy status: Secondary | ICD-10-CM | POA: Insufficient documentation

## 2019-06-15 DIAGNOSIS — Z881 Allergy status to other antibiotic agents status: Secondary | ICD-10-CM | POA: Insufficient documentation

## 2019-06-15 DIAGNOSIS — R002 Palpitations: Secondary | ICD-10-CM | POA: Diagnosis not present

## 2019-06-15 DIAGNOSIS — K219 Gastro-esophageal reflux disease without esophagitis: Secondary | ICD-10-CM | POA: Diagnosis not present

## 2019-06-15 DIAGNOSIS — G459 Transient cerebral ischemic attack, unspecified: Principal | ICD-10-CM | POA: Insufficient documentation

## 2019-06-15 DIAGNOSIS — R29818 Other symptoms and signs involving the nervous system: Secondary | ICD-10-CM | POA: Diagnosis present

## 2019-06-15 DIAGNOSIS — Z880 Allergy status to penicillin: Secondary | ICD-10-CM | POA: Diagnosis not present

## 2019-06-15 DIAGNOSIS — M6281 Muscle weakness (generalized): Secondary | ICD-10-CM | POA: Diagnosis not present

## 2019-06-15 DIAGNOSIS — R413 Other amnesia: Secondary | ICD-10-CM

## 2019-06-15 DIAGNOSIS — R42 Dizziness and giddiness: Secondary | ICD-10-CM | POA: Diagnosis not present

## 2019-06-15 DIAGNOSIS — Z91030 Bee allergy status: Secondary | ICD-10-CM | POA: Diagnosis not present

## 2019-06-15 DIAGNOSIS — E785 Hyperlipidemia, unspecified: Secondary | ICD-10-CM | POA: Insufficient documentation

## 2019-06-15 DIAGNOSIS — Z20822 Contact with and (suspected) exposure to covid-19: Secondary | ICD-10-CM | POA: Insufficient documentation

## 2019-06-15 DIAGNOSIS — I10 Essential (primary) hypertension: Secondary | ICD-10-CM | POA: Diagnosis not present

## 2019-06-15 LAB — PROTIME-INR
INR: 0.9 (ref 0.8–1.2)
Prothrombin Time: 11.6 s (ref 11.4–15.2)

## 2019-06-15 LAB — I-STAT CHEM 8, ED
BUN: 15 mg/dL (ref 8–23)
Calcium, Ion: 1.24 mmol/L (ref 1.15–1.40)
Chloride: 105 mmol/L (ref 98–111)
Creatinine, Ser: 1.2 mg/dL — ABNORMAL HIGH (ref 0.44–1.00)
Glucose, Bld: 114 mg/dL — ABNORMAL HIGH (ref 70–99)
HCT: 40 % (ref 36.0–46.0)
Hemoglobin: 13.6 g/dL (ref 12.0–15.0)
Potassium: 3.9 mmol/L (ref 3.5–5.1)
Sodium: 141 mmol/L (ref 135–145)
TCO2: 31 mmol/L (ref 22–32)

## 2019-06-15 LAB — VITAMIN B12: Vitamin B-12: 2155 pg/mL — ABNORMAL HIGH (ref 180–914)

## 2019-06-15 LAB — ETHANOL: Alcohol, Ethyl (B): <10 mg/dL (ref <10)

## 2019-06-15 LAB — URINALYSIS, ROUTINE W REFLEX MICROSCOPIC
Bilirubin Urine: NEGATIVE
Glucose, UA: NEGATIVE mg/dL
Hgb urine dipstick: NEGATIVE
Ketones, ur: NEGATIVE mg/dL
Leukocytes,Ua: NEGATIVE
Nitrite: NEGATIVE
Protein, ur: NEGATIVE mg/dL
Specific Gravity, Urine: 1.003 — ABNORMAL LOW (ref 1.005–1.030)
pH: 7 (ref 5.0–8.0)

## 2019-06-15 LAB — DIFFERENTIAL
Abs Immature Granulocytes: 0.03 10*3/uL (ref 0.00–0.07)
Basophils Absolute: 0.1 10*3/uL (ref 0.0–0.1)
Basophils Relative: 1 %
Eosinophils Absolute: 0.2 10*3/uL (ref 0.0–0.5)
Eosinophils Relative: 3 %
Immature Granulocytes: 1 %
Lymphocytes Relative: 23 %
Lymphs Abs: 1.3 10*3/uL (ref 0.7–4.0)
Monocytes Absolute: 0.4 10*3/uL (ref 0.1–1.0)
Monocytes Relative: 7 %
Neutro Abs: 3.7 10*3/uL (ref 1.7–7.7)
Neutrophils Relative %: 65 %

## 2019-06-15 LAB — COMPREHENSIVE METABOLIC PANEL WITH GFR
ALT: 35 U/L (ref 0–44)
AST: 32 U/L (ref 15–41)
Albumin: 4.4 g/dL (ref 3.5–5.0)
Alkaline Phosphatase: 96 U/L (ref 38–126)
Anion gap: 10 (ref 5–15)
BUN: 15 mg/dL (ref 8–23)
CO2: 25 mmol/L (ref 22–32)
Calcium: 9.4 mg/dL (ref 8.9–10.3)
Chloride: 101 mmol/L (ref 98–111)
Creatinine, Ser: 0.95 mg/dL (ref 0.44–1.00)
GFR calc Af Amer: >60 mL/min
GFR calc non Af Amer: 57 mL/min — ABNORMAL LOW
Glucose, Bld: 112 mg/dL — ABNORMAL HIGH (ref 70–99)
Potassium: 3.6 mmol/L (ref 3.5–5.1)
Sodium: 136 mmol/L (ref 135–145)
Total Bilirubin: 0.4 mg/dL (ref 0.3–1.2)
Total Protein: 7.8 g/dL (ref 6.5–8.1)

## 2019-06-15 LAB — RAPID URINE DRUG SCREEN, HOSP PERFORMED
Amphetamines: NOT DETECTED
Barbiturates: NOT DETECTED
Benzodiazepines: NOT DETECTED
Cocaine: NOT DETECTED
Opiates: NOT DETECTED
Tetrahydrocannabinol: NOT DETECTED

## 2019-06-15 LAB — APTT: aPTT: 29 s (ref 24–36)

## 2019-06-15 LAB — CBG MONITORING, ED: Glucose-Capillary: 112 mg/dL — ABNORMAL HIGH (ref 70–99)

## 2019-06-15 LAB — CBC
HCT: 41.6 % (ref 36.0–46.0)
Hemoglobin: 13.6 g/dL (ref 12.0–15.0)
MCH: 31.3 pg (ref 26.0–34.0)
MCHC: 32.7 g/dL (ref 30.0–36.0)
MCV: 95.6 fL (ref 80.0–100.0)
Platelets: 287 10*3/uL (ref 150–400)
RBC: 4.35 MIL/uL (ref 3.87–5.11)
RDW: 13.6 % (ref 11.5–15.5)
WBC: 5.6 10*3/uL (ref 4.0–10.5)
nRBC: 0 % (ref 0.0–0.2)

## 2019-06-15 LAB — TSH: TSH: 3.881 u[IU]/mL (ref 0.350–4.500)

## 2019-06-15 MED ORDER — POLYVINYL ALCOHOL 1.4 % OP SOLN
1.0000 [drp] | Freq: Two times a day (BID) | OPHTHALMIC | Status: DC
  Administered 2019-06-15 – 2019-06-16 (×2): 1 [drp] via OPHTHALMIC
  Filled 2019-06-15: qty 15

## 2019-06-15 MED ORDER — PANTOPRAZOLE SODIUM 40 MG PO TBEC
40.0000 mg | DELAYED_RELEASE_TABLET | Freq: Every day | ORAL | Status: DC
  Administered 2019-06-16: 12:00:00 40 mg via ORAL
  Filled 2019-06-15: qty 1

## 2019-06-15 MED ORDER — LORAZEPAM 2 MG/ML IJ SOLN: 1.0000 mg | Freq: Once | INTRAMUSCULAR | Status: DC

## 2019-06-15 MED ORDER — CALCIUM CARBONATE ANTACID 500 MG PO CHEW: 1.0000 | CHEWABLE_TABLET | Freq: Every day | ORAL | Status: DC | PRN

## 2019-06-15 MED ORDER — STROKE: EARLY STAGES OF RECOVERY BOOK: Freq: Once | Status: DC

## 2019-06-15 MED ORDER — AMLODIPINE BESYLATE 5 MG PO TABS
5.0000 mg | ORAL_TABLET | Freq: Every day | ORAL | Status: DC
  Administered 2019-06-16: 09:00:00 5 mg via ORAL
  Filled 2019-06-15: qty 1

## 2019-06-15 MED ORDER — VITAMIN D 25 MCG (1000 UNIT) PO TABS
1000.0000 [IU] | ORAL_TABLET | Freq: Every day | ORAL | Status: DC
  Administered 2019-06-16: 12:00:00 1000 [IU] via ORAL
  Filled 2019-06-15: qty 1

## 2019-06-15 MED ORDER — ASCORBIC ACID 500 MG PO TABS
1000.0000 mg | ORAL_TABLET | Freq: Every day | ORAL | Status: DC
  Administered 2019-06-16: 12:00:00 1000 mg via ORAL
  Filled 2019-06-15: qty 2

## 2019-06-15 MED ORDER — MAGNESIUM OXIDE 400 (241.3 MG) MG PO TABS
200.0000 mg | ORAL_TABLET | Freq: Every day | ORAL | Status: DC
  Administered 2019-06-16: 09:00:00 200 mg via ORAL
  Filled 2019-06-15: qty 1

## 2019-06-15 MED ORDER — NADOLOL 20 MG PO TABS
20.0000 mg | ORAL_TABLET | Freq: Every evening | ORAL | Status: DC
  Administered 2019-06-15: 23:00:00 20 mg via ORAL
  Filled 2019-06-15 (×4): qty 1

## 2019-06-15 MED ORDER — POTASSIUM CHLORIDE CRYS ER 20 MEQ PO TBCR
20.0000 meq | EXTENDED_RELEASE_TABLET | Freq: Every day | ORAL | Status: DC
  Administered 2019-06-16: 09:00:00 20 meq via ORAL
  Filled 2019-06-15: qty 1

## 2019-06-15 MED ORDER — HYDRALAZINE HCL 20 MG/ML IJ SOLN: 5.0000 mg | INTRAMUSCULAR | Status: DC | PRN

## 2019-06-15 MED ORDER — ACETAMINOPHEN 325 MG PO TABS: 650.0000 mg | ORAL_TABLET | ORAL | Status: DC | PRN

## 2019-06-15 MED ORDER — GADOBUTROL 1 MMOL/ML IV SOLN
5.0000 mL | Freq: Once | INTRAVENOUS | Status: AC | PRN
  Administered 2019-06-15: 5 mL via INTRAVENOUS

## 2019-06-15 MED ORDER — VITAMIN B-6 50 MG PO TABS
100.0000 mg | ORAL_TABLET | Freq: Every day | ORAL | Status: DC
  Administered 2019-06-16: 12:00:00 100 mg via ORAL
  Filled 2019-06-15: qty 2

## 2019-06-15 MED ORDER — NADOLOL 40 MG PO TABS
40.0000 mg | ORAL_TABLET | Freq: Every day | ORAL | Status: DC
  Administered 2019-06-16: 11:00:00 40 mg via ORAL
  Filled 2019-06-15 (×5): qty 1

## 2019-06-15 MED ORDER — HEPARIN SODIUM (PORCINE) 5000 UNIT/ML IJ SOLN
5000.0000 [IU] | Freq: Three times a day (TID) | INTRAMUSCULAR | Status: DC
  Administered 2019-06-15 – 2019-06-16 (×2): 5000 [IU] via SUBCUTANEOUS
  Filled 2019-06-15 (×2): qty 1

## 2019-06-15 MED ORDER — ASPIRIN 81 MG PO CHEW
324.0000 mg | CHEWABLE_TABLET | Freq: Once | ORAL | Status: AC
  Administered 2019-06-15: 19:00:00 324 mg via ORAL
  Filled 2019-06-15: qty 4

## 2019-06-15 MED ORDER — ZINC SULFATE 220 (50 ZN) MG PO CAPS
220.0000 mg | ORAL_CAPSULE | Freq: Every day | ORAL | Status: DC
  Administered 2019-06-16: 12:00:00 220 mg via ORAL
  Filled 2019-06-15: qty 1

## 2019-06-15 MED ORDER — FLAXSEED OIL 1000 MG PO CAPS: 1.0000 | ORAL_CAPSULE | Freq: Every day | ORAL | Status: DC

## 2019-06-15 MED ORDER — ADULT MULTIVITAMIN W/MINERALS CH
1.0000 | ORAL_TABLET | Freq: Every day | ORAL | Status: DC
  Administered 2019-06-16: 12:00:00 1 via ORAL
  Filled 2019-06-15: qty 1

## 2019-06-15 MED ORDER — ACETAMINOPHEN 650 MG RE SUPP: 650.0000 mg | RECTAL | Status: DC | PRN

## 2019-06-15 MED ORDER — COQ10 100 MG PO CAPS: 1.0000 | ORAL_CAPSULE | Freq: Every day | ORAL | Status: DC

## 2019-06-15 MED ORDER — ALPRAZOLAM 0.5 MG PO TABS
0.5000 mg | ORAL_TABLET | Freq: Two times a day (BID) | ORAL | Status: DC | PRN
  Administered 2019-06-15: 22:00:00 0.5 mg via ORAL
  Filled 2019-06-15: qty 1

## 2019-06-15 MED ORDER — ACETAMINOPHEN 160 MG/5ML PO SOLN: 650.0000 mg | ORAL | Status: DC | PRN

## 2019-06-15 MED ORDER — HYDRALAZINE HCL 20 MG/ML IJ SOLN
5.0000 mg | Freq: Once | INTRAMUSCULAR | Status: AC
  Administered 2019-06-15: 17:00:00 5 mg via INTRAVENOUS
  Filled 2019-06-15: qty 1

## 2019-06-15 NOTE — ED Triage Notes (Signed)
Pt reports she noticed having difficulty remembering things around 2 pm today and felt very tired and nauseated.  Pt says still nauseated but can remember better.  EDP in triage assessing pt.

## 2019-06-15 NOTE — Consult Note (Signed)
[facility] Tele[facility]urology Consult Services  Stat Consult  Date of Service:   06/15/2019 15:50:25  Impression:     .  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 Ddx includes stroke, seizure. Recommend MRI brain, EEG, improvement in BP towards normotension.  CT HEAD: Showed No Acute Hemorrhage or Acute Core Infarct Reviewed  Metrics: [facility] Notification Time: 06/15/2019 15:49:00 Stamp Time: 06/15/2019 15:50:25 Callback Response Time: 06/15/2019 15:51:32  Our recommendations are outlined below.  Recommendations:     .  EEG, MRI brain w/o and w/.  Imaging Studies:     .  MRI Head with and Without Contrast  Other WorkUp:     .  Check B12 level     .  Check TSH     .  Check Urinalysis  Disposition: Neurology Follow Up Recommended  Sign Out:     .  Discussed with Emergency Department Provider  ----------------------------------------------------------------------------------------------------  Chief Complaint: memory loss  History of Present Illness: Patient is a 79 year old Female.  Pt states that she had approximately 20 mins of memory loss around 2 p.m. today. This was associated with a feeling of fatigue and of nausea.  She states that she is back to baseline. No other complaints currently. She has a h/o hypertension and hyperlipidemia. No prior similar episodes. She denies headache. She denies new medications or inciting emotional event today.   Past Medical History:     . Hypertension     . Hyperlipidemia     . There is NO history of Diabetes Mellitus     . There is NO history of Atrial Fibrillation     . There is NO history of Coronary Artery Disease     . There is NO history of Stroke  Anticoagulant use:  No  Antiplatelet use: No    Examination: BP(198/77), Pulse(73), Blood Glucose(112) 1A: Level of Consciousness - Alert; keenly  responsive + 0 1B: Ask Month and Age - Both Questions Right + 0 1C: Blink Eyes & Squeeze Hands - Performs Both Tasks + 0 2: Test Horizontal Extraocular Movements - Normal + 0 3: Test Visual Fields - No Visual Loss + 0 4: Test Facial Palsy (Use Grimace if Obtunded) - Normal symmetry + 0 5A: Test Left Arm Motor Drift - No Drift for 10 Seconds + 0 5B: Test Right Arm Motor Drift - No Drift for 10 Seconds + 0 6A: Test Left Leg Motor Drift - No Drift for 5 Seconds + 0 6B: Test Right Leg Motor Drift - No Drift for 5 Seconds + 0 7: Test Limb Ataxia (FNF/Heel-Shin) - No Ataxia + 0 8: Test Sensation - Normal; No sensory loss + 0 9: Test Language/Aphasia - Normal; No aphasia + 0 10: Test Dysarthria - Normal + 0 11: Test Extinction/Inattention - No abnormality + 0  NIHSS Score: 0   Patient/Family was informed the Neurology Consult would occur via TeleHealth consult by way of interactive audio and video telecommunications and consented to receiving care in this manner.    Dr Jeralene Huff   [facility] [phone]  Case [id number]

## 2019-06-15 NOTE — ED Notes (Addendum)
Pt transported to MRI. Therefore, cardiac monitor removed.

## 2019-06-15 NOTE — H&P (Addendum)
TRH H&P   Patient Demographics:    Lindsay Petty, is a 79 y.o. female  MRN: [id number]   DOB - December 01, 1940  Admit Date - 06/15/2019  Outpatient Primary MD for the patient is Berenice Primas  Referring MD/NP/PA: Dr Roderic Palau  Patient coming from: Home  Chief Complaint  Patient presents with  . stroke symptoms      HPI:    Lindsay Petty  is a 79 y.o. female, medical history of hypertension, hyperlipidemia, palpitations secondary to PACs by cardiology, patient presents to ED secondary to complaints of memory deficits, patient stated she had an episode which lasted for approximately 20 minutes of memory loss, started around 2 PM day, report it was associated with feeling of fatigue, and nausea, he denies any focal deficits, tingling, numbness, no slurred speech, no facial droop(husband at bedside) report mainly she was forgetful for few names some family members and friends,, she denies any prior similar episodes, denies any headache, she denies any new medications, no inciting emotional events today. -In ED work-up was significant for uncontrolled blood pressure with systolic being in the [id number], she reports usually her blood pressures controlled at home, CT head with no acute findings, no significant lab abnormalities, teleneurologist were consulted who recommended admission for MRI and EEG and transglobal amnesia work-up.    Review of systems:    In addition to the HPI above,  No Fever-chills, No Headache, No changes with Vision or hearing, No problems swallowing food or Liquids, No Chest pain, Cough or Shortness of Breath, No Abdominal pain, No Nausea or Vommitting, Bowel movements are regular, No Blood in stool or Urine, No dysuria, No new skin rashes or bruises, No new joints pains-aches,  Patient denies any focal tingling, numbness or deficits, she reports  forgetfulness and amnesia . No recent weight gain or loss, No polyuria, polydypsia or polyphagia, No significant Mental Stressors.  A full 10 point Review of Systems was done, except as stated above, all other Review of Systems were negative.   With Past History of the following :    Past Medical History:  Diagnosis Date  . Atropine adverse reaction    atropine and sensitivity by history  . Chest pain    cardiac catheterization 2003, no major epicardial disease, small distal vessels I  . Ejection fraction    65%.Marland Kitchenecho April 2003  . GERD (gastroesophageal reflux disease)   . Hx of cholecystectomy 03/2006   lap   . Hyperlipemia   . Hypertension    renal artery ultrasound, January, 2011, no significant renal artery stenosis,  ... medications make her feel tired  . Jaw pain    etiology uncertain  . Palpitations   . Statin intolerance    muscle weakness  . Vertigo    Mild positional vertigo      Past Surgical History:  Procedure Laterality Date  . BREAST BIOPSY  x2 for benign disease  . CARDIAC CATHETERIZATION    . CHOLECYSTECTOMY     (lap) 2008.  [name] COLONOSCOPY N/A 11/22/2013   Procedure: COLONOSCOPY;  Surgeon: Rogene Houston, MD;  Location: [facility];  Service: Endoscopy;  Laterality: N/A;  200  . rt knee arthroscopy        Social History:     Social History   Tobacco Use  . Smoking status: Never Smoker  . Smokeless tobacco: Never Used  Substance Use Topics  . Alcohol use: No    Alcohol/week: 0.0 standard drinks        Family History :     Family History  Problem Relation Age of Onset  . Coronary artery disease Other        unknown   Reports she has 14 siblings, currently only 65 are alive, she does report history of CAD and multiple siblings.  Home Medications:   Prior to Admission medications   Medication Sig Start Date End Date Taking? Authorizing Provider  ALPRAZolam Duanne Moron) 0.5 MG tablet Take 0.5 mg by mouth 2 (two) times daily as  needed for anxiety.    Yes [provider]  amLODipine (NORVASC) 5 MG tablet Take 1 tablet (5 mg total) by mouth daily. 04/04/19  Yes BranchAlphonse Guild, MD  Ascorbic Acid (VITAMIN C) 1000 MG tablet Take 1,000 mg by mouth daily.   Yes [provider]  calcium carbonate (TUMS - DOSED IN MG ELEMENTAL CALCIUM) 500 MG chewable tablet Chew 1 tablet by mouth daily as needed.    Yes [provider]  Cholecalciferol (VITAMIN D3) 1000 UNITS CAPS Take 1 capsule by mouth daily.    Yes [provider]  Coenzyme Q10 (COQ10) 100 MG CAPS Take 1 capsule by mouth daily.     Yes [provider]  esomeprazole (NEXIUM) 20 MG capsule Take 20 mg by mouth daily.    Yes [provider]  Flaxseed, Linseed, (FLAXSEED OIL) 1000 MG CAPS Take 1 capsule by mouth daily.    Yes [provider]  furosemide (LASIX) 40 MG tablet Take 1 tablet (40 mg total) by mouth daily as needed (swelling). 04/04/19  Yes BranchAlphonse Guild, MD  Magnesium 200 MG TABS Take 200 mg by mouth daily.    Yes [provider]  Multiple Vitamin (MULTIVITAMIN) tablet Take 1 tablet by mouth daily.     Yes [provider]  nadolol (CORGARD) 40 MG tablet TAKE 1 TABLET IN THE MORNING AND 1/2 TABLET IN THE EVENING. 04/04/19  Yes Branch, [name], MD  potassium chloride SA (KLOR-CON) 20 MEQ tablet TAKE 1 TABLET BY MOUTH ONCE DAILY. 06/12/19  Yes Branch, [name], MD  Propylene Glycol (SYSTANE COMPLETE) 0.6 % SOLN Place 1 drop into both eyes in the morning and at bedtime.   Yes [provider]  pyridOXINE (VITAMIN B-6) 100 MG tablet Take 100 mg by mouth daily.   Yes [provider]  zinc gluconate 50 MG tablet Take 50 mg by mouth daily.   Yes [provider]     Allergies:     Allergies  Allergen Reactions  . Bee Venom Anaphylaxis  . Amoxicillin Hives  . Atropine     Heart race   . Doxycycline Swelling  . Band-Aid Plus Antibiotic  [Bacitracin-Polymyxin B] Rash  . Latex Rash     Physical Exam:   Vitals  Blood pressure (!) 193/85, pulse 85, temperature 98 F (36.7 C), temperature source Oral,  resp. rate 12, height 5' (1.524 m), weight 71.5 kg, SpO2 97 %.   1. General developed female, laying in bed in no apparent distress  2. Normal affect and insight, Not Suicidal or Homicidal, Awake Alert, Oriented X 3.  3. No F.N deficits, ALL C.Nerves Intact, Strength 5/5 all 4 extremities, Sensation intact all 4 extremities, Plantars down going.  4. Ears and Eyes appear Normal, Conjunctivae clear, PERRLA. Moist Oral Mucosa.  5. Supple Neck, No JVD, No cervical lymphadenopathy appriciated, No Carotid Bruits.  6. Symmetrical Chest wall movement, Good air movement bilaterally, CTAB.  7. RRR, No Gallops, Rubs or Murmurs, No Parasternal Heave.  8. Positive Bowel Sounds, Abdomen Soft, No tenderness, No organomegaly appriciated,No rebound -guarding or rigidity.  9.  No Cyanosis, Normal Skin Turgor, No Skin Rash or Bruise.  10. Good muscle tone,  joints appear normal , no effusions, Normal ROM.  11. No Palpable Lymph Nodes in Neck or Axillae     Data Review:    CBC Recent Labs  Lab 06/15/19 1501 06/15/19 1504  WBC 5.6  --   HGB 13.6 13.6  HCT 41.6 40.0  PLT 287  --   MCV 95.6  --   MCH 31.3  --   MCHC 32.7  --   RDW 13.6  --   LYMPHSABS 1.3  --   MONOABS 0.4  --   EOSABS 0.2  --   BASOSABS 0.1  --    ------------------------------------------------------------------------------------------------------------------  Chemistries  Recent Labs  Lab 06/15/19 1501 06/15/19 1504  NA 136 141  K 3.6 3.9  CL 101 105  CO2 25  --   GLUCOSE 112* 114*  BUN 15 15  CREATININE 0.95 1.20*  CALCIUM 9.4  --   AST 32  --   ALT 35  --   ALKPHOS 96  --   BILITOT 0.4  --    ------------------------------------------------------------------------------------------------------------------ estimated creatinine  clearance is 33 mL/min (A) (by C-G formula based on SCr of 1.2 mg/dL (H)). ------------------------------------------------------------------------------------------------------------------ No results for input(s): TSH, T4TOTAL, T3FREE, THYROIDAB in the last 72 hours.  Invalid input(s): FREET3  Coagulation profile Recent Labs  Lab 06/15/19 1501  INR 0.9   ------------------------------------------------------------------------------------------------------------------- No results for input(s): DDIMER in the last 72 hours. -------------------------------------------------------------------------------------------------------------------  Cardiac Enzymes No results for input(s): CKMB, TROPONINI, MYOGLOBIN in the last 168 hours.  Invalid input(s): CK ------------------------------------------------------------------------------------------------------------------ No results found for: BNP   ---------------------------------------------------------------------------------------------------------------  Urinalysis    Component Value Date/Time   COLORURINE STRAW (A) 06/15/2019 1600   APPEARANCEUR CLEAR 06/15/2019 1600   LABSPEC 1.003 (L) 06/15/2019 1600   PHURINE 7.0 06/15/2019 1600   GLUCOSEU NEGATIVE 06/15/2019 1600   HGBUR NEGATIVE 06/15/2019 1600   BILIRUBINUR NEGATIVE 06/15/2019 1600   KETONESUR NEGATIVE 06/15/2019 1600   PROTEINUR NEGATIVE 06/15/2019 1600   NITRITE NEGATIVE 06/15/2019 1600   LEUKOCYTESUR NEGATIVE 06/15/2019 1600    ----------------------------------------------------------------------------------------------------------------   Imaging Results:    CT HEAD CODE STROKE WO CONTRAST  Result Date: 06/15/2019 CLINICAL DATA:  Forgetfulness, dizziness EXAM: CT HEAD WITHOUT CONTRAST TECHNIQUE: Contiguous axial images were obtained from the base of the skull through the vertex without intravenous contrast. COMPARISON:  None. FINDINGS: Brain: There is no acute  intracranial hemorrhage, mass effect, or edema. Gray-white differentiation is preserved. There is no extra-axial fluid collection. Ventricles and sulci are within normal limits in size and configuration. Patchy hypoattenuation in the supratentorial white matter is nonspecific but may reflect mild chronic microvascular ischemic changes. Vascular: No hyperdense vessel.There is  atherosclerotic calcification at the skull base. Skull: Calvarium is unremarkable. Sinuses/Orbits: No acute finding. Other: None. IMPRESSION: No acute intracranial hemorrhage or evidence of acute infarction. Mild chronic microvascular ischemic changes. Electronically Signed   By: Macy Mis M.D.   On: 06/15/2019 15:53    My personal review of EKG: Rhythm NSR, Rate  73   Assessment & Plan:    Active Problems:   Esophageal reflux   GERD (gastroesophageal reflux disease)   Hyperlipemia   Hypertension   Palpitations   Transient neurologic deficit   Transient  focal deficits -She presents with transient episode of memory disturbance, seen by teleneurology who recommended admission for further work-up, differential includes transient global amnesia, versus hypertensive urgency, will possibly TIA versus seizures. -Patient will be admitted to telemetry floor, will monitor on telemetry, will obtain CVA work-up including carotid Dopplers, 2D echo, telemetry monitoring, obtain MRI brain, it will be with and without contrast per neurology recommendation,, as well will obtain EEG., -We will aim towards blood pressure normalization per neurology recommendation, as hypertensive urgency would be contributing. -Obtain B12, TSH. -Check lipid panel and hemoglobin A1c -Will consult neurology. -Will give full dose aspirin.  Hypertensive urgency/hypertension -May be contributing to her symptoms, so goal will be towards normalization, will resume home dose amlodipine and nadolol, and will add as needed hydralazine.  GERD -Continue with  PPI  Palpitations -Continue with nadolol, followed by cardiology.  Hyperlipidemia -Statin intolerant, recheck lipid panel  DVT Prophylaxis Heparin   AM Labs Ordered, also please review Full Orders  Family Communication: Admission, patients condition and plan of care including tests being ordered have been discussed with the patient and Husband at bedside who indicate understanding and agree with the plan and Code Status.  Code Status Full  Likely DC to  Home  Condition GUARDED    Consults called: Tele Neurology  Admission status:   Observation  Time spent in minutes : 50 minutes   Phillips Climes M.D on 06/15/2019 at 5:15 PM  Between 7am to 7pm - Pager - [phone]. After 7pm go to [email or web address] - password [email or web address]  [facility] - Office  [phone]

## 2019-06-15 NOTE — Progress Notes (Signed)
PHARMACIST - PHYSICIAN ORDER COMMUNICATION  CONCERNING: P&T Medication Policy on Herbal Medications  DESCRIPTION:  This patient's orders for: Coenzyme Q10 and Flaxseed Oil have been noted.  This product(s) is classified as an "herbal" or natural product. Due to a lack of definitive safety studies or FDA approval, nonstandard manufacturing practices, plus the potential risk of unknown drug-drug interactions while on inpatient medications, the Pharmacy and Therapeutics Committee does not permit the use of "herbal" or natural products of this type within [facility].   ACTION TAKEN: The pharmacy department is unable to verify this order at this time and your patient has been informed of this safety policy. Please reevaluate patient's clinical condition at discharge and address if the herbal or natural product(s) should be resumed at that time.   Lindell Spar, PharmD, BCPS Clinical Pharmacist 06/15/2019 8:33 PM

## 2019-06-15 NOTE — ED Notes (Addendum)
ED TO INPATIENT HANDOFF REPORT  ED Nurse Name and Phone #:   S Name/Age/Gender [name] 79 y.o. female Room/Bed: APA16A/APA16A  Code Status   Code Status: Not on file  Home/SNF/Other Home Patient oriented to: self, place, time and situation Is this baseline? Yes   Triage Complete: Triage complete  Chief Complaint Transient neurologic deficit [R29.818]  Triage Note Pt reports she noticed having difficulty remembering things around 2 pm today and felt very tired and nauseated.  Pt says still nauseated but can remember better.  EDP in triage assessing pt.     Allergies Allergies  Allergen Reactions  . Bee Venom Anaphylaxis  . Amoxicillin Hives  . Atropine     Heart race   . Doxycycline Swelling  . Band-Aid Plus Antibiotic [Bacitracin-Polymyxin B] Rash  . Latex Rash    Level of Care/Admitting Diagnosis ED Disposition    ED Disposition Condition [facility] Area: [facility] [id number]  Level of Care: Telemetry [5]  Covid Evaluation: Asymptomatic Screening Protocol (No Symptoms)  Diagnosis: Transient neurologic deficit [id number]  Admitting Physician: Manfred Shirts  Attending Physician: Waldron Labs, DAWOOD S [[id number]]       B Medical/Surgery History Past Medical History:  Diagnosis Date  . Atropine adverse reaction    atropine and sensitivity by history  . Chest pain    cardiac catheterization 2003, no major epicardial disease, small distal vessels I  . Ejection fraction    65%.Marland Kitchenecho April 2003  . GERD (gastroesophageal reflux disease)   . Hx of cholecystectomy 03/2006   lap   . Hyperlipemia   . Hypertension    renal artery ultrasound, January, 2011, no significant renal artery stenosis,  ... medications make her feel tired  . Jaw pain    etiology uncertain  . Palpitations   . Statin intolerance    muscle weakness  . Vertigo    Mild positional vertigo   Past Surgical History:  Procedure Laterality Date  .  BREAST BIOPSY     x2 for benign disease  . CARDIAC CATHETERIZATION    . CHOLECYSTECTOMY     (lap) 2008.  [name] COLONOSCOPY N/A 11/22/2013   Procedure: COLONOSCOPY;  Surgeon: Rogene Houston, MD;  Location: [facility];  Service: Endoscopy;  Laterality: N/A;  200  . rt knee arthroscopy       A IV Location/Drains/Wounds Patient Lines/Drains/Airways Status   Active Line/Drains/Airways    None          Intake/Output Last 24 hours No intake or output data in the 24 hours ending 06/15/19 1931  Labs/Imaging Results for orders placed or performed during the hospital encounter of 06/15/19 (from the past 48 hour(s))  CBG monitoring, ED     Status: Abnormal   Collection Time: 06/15/19  2:55 PM  Result Value Ref Range   Glucose-Capillary 112 (H) 70 - 99 mg/dL    Comment: Glucose reference range applies only to samples taken after fasting for at least 8 hours.  Ethanol     Status: None   Collection Time: 06/15/19  3:01 PM  Result Value Ref Range   Alcohol, Ethyl (B) <10 <10 mg/dL    Comment: (NOTE) Lowest detectable limit for serum alcohol is 10 mg/dL. For medical purposes only. Performed at [facility], [address] [place], [place] [address]   Protime-INR     Status: None   Collection Time: 06/15/19  3:01 PM  Result Value Ref Range  Prothrombin Time 11.6 11.4 - 15.2 seconds   INR 0.9 0.8 - 1.2    Comment: (NOTE) INR goal varies based on device and disease states. Performed at [facility], [address], [place], [place] [address]   APTT     Status: None   Collection Time: 06/15/19  3:01 PM  Result Value Ref Range   aPTT 29 24 - 36 seconds    Comment: Performed at [facility], [address] [place], [place] [address]  CBC     Status: None   Collection Time: 06/15/19  3:01 PM  Result Value Ref Range   WBC 5.6 4.0 - 10.5 K/uL   RBC 4.35 3.87 - 5.11 MIL/uL   Hemoglobin 13.6 12.0 - 15.0 g/dL   HCT 41.6 36.0 - 46.0 %   MCV 95.6 80.0 - 100.0 fL   MCH 31.3 26.0 - 34.0 pg    MCHC 32.7 30.0 - 36.0 g/dL   RDW 13.6 11.5 - 15.5 %   Platelets 287 150 - 400 K/uL   nRBC 0.0 0.0 - 0.2 %    Comment: Performed at [facility], [address] [place], [place] [address]  Differential     Status: None   Collection Time: 06/15/19  3:01 PM  Result Value Ref Range   Neutrophils Relative % 65 %   Neutro Abs 3.7 1.7 - 7.7 K/uL   Lymphocytes Relative 23 %   Lymphs Abs 1.3 0.7 - 4.0 K/uL   Monocytes Relative 7 %   Monocytes Absolute 0.4 0.1 - 1.0 K/uL   Eosinophils Relative 3 %   Eosinophils Absolute 0.2 0.0 - 0.5 K/uL   Basophils Relative 1 %   Basophils Absolute 0.1 0.0 - 0.1 K/uL   Immature Granulocytes 1 %   Abs Immature Granulocytes 0.03 0.00 - 0.07 K/uL    Comment: Performed at [facility], [address] [place], [place] [address]  Comprehensive metabolic panel     Status: Abnormal   Collection Time: 06/15/19  3:01 PM  Result Value Ref Range   Sodium 136 135 - 145 mmol/L   Potassium 3.6 3.5 - 5.1 mmol/L   Chloride 101 98 - 111 mmol/L   CO2 25 22 - 32 mmol/L   Glucose, Bld 112 (H) 70 - 99 mg/dL    Comment: Glucose reference range applies only to samples taken after fasting for at least 8 hours.   BUN 15 8 - 23 mg/dL   Creatinine, Ser 0.95 0.44 - 1.00 mg/dL   Calcium 9.4 8.9 - 10.3 mg/dL   Total Protein 7.8 6.5 - 8.1 g/dL   Albumin 4.4 3.5 - 5.0 g/dL   AST 32 15 - 41 U/L   ALT 35 0 - 44 U/L   Alkaline Phosphatase 96 38 - 126 U/L   Total Bilirubin 0.4 0.3 - 1.2 mg/dL   GFR calc non Af Amer 57 (L) >60 mL/min   GFR calc Af Amer >60 >60 mL/min   Anion gap 10 5 - 15    Comment: Performed at [facility], [address] [place], [place] [address]  TSH     Status: None   Collection Time: 06/15/19  3:02 PM  Result Value Ref Range   TSH 3.881 0.350 - 4.500 uIU/mL    Comment: Performed by a 3rd Generation assay with a functional sensitivity of <=0.01 uIU/mL. Performed at [facility], [address], [place], [place] [address]   [place] [facility], ED     Status:  Abnormal   Collection Time: 06/15/19  3:04 PM  Result Value Ref Range   Sodium 141 135 - 145 mmol/L   Potassium 3.9 3.5 - 5.1 mmol/L   Chloride 105 98 - 111 mmol/L   BUN 15 8 - 23 mg/dL   Creatinine, Ser 1.20 (H) 0.44 - 1.00 mg/dL   Glucose, Bld 114 (H) 70 - 99 mg/dL    Comment: Glucose reference range applies only to samples taken after fasting for at least 8 hours.   Calcium, Ion 1.24 1.15 - 1.40 mmol/L   TCO2 31 22 - 32 mmol/L   Hemoglobin 13.6 12.0 - 15.0 g/dL   HCT 40.0 36.0 - 46.0 %  Urine rapid drug screen (hosp performed)     Status: None   Collection Time: 06/15/19  4:00 PM  Result Value Ref Range   Opiates NONE DETECTED NONE DETECTED   Cocaine NONE DETECTED NONE DETECTED   Benzodiazepines NONE DETECTED NONE DETECTED   Amphetamines NONE DETECTED NONE DETECTED   Tetrahydrocannabinol NONE DETECTED NONE DETECTED   Barbiturates NONE DETECTED NONE DETECTED    Comment: (NOTE) DRUG SCREEN FOR MEDICAL PURPOSES ONLY.  IF CONFIRMATION IS NEEDED FOR ANY PURPOSE, NOTIFY LAB WITHIN 5 DAYS. LOWEST DETECTABLE LIMITS FOR URINE DRUG SCREEN Drug Class                     Cutoff (ng/mL) Amphetamine and metabolites    1000 Barbiturate and metabolites    200 Benzodiazepine                 [id number] Tricyclics and metabolites     300 Opiates and metabolites        300 Cocaine and metabolites        300 THC                            50 Performed at [facility], [address], [place], [place] [address]   Urinalysis, Routine w reflex microscopic     Status: Abnormal   Collection Time: 06/15/19  4:00 PM  Result Value Ref Range   Color, Urine STRAW (A) YELLOW   APPearance CLEAR CLEAR   Specific Gravity, Urine 1.003 (L) 1.005 - 1.030   pH 7.0 5.0 - 8.0   Glucose, UA NEGATIVE NEGATIVE mg/dL   Hgb urine dipstick NEGATIVE NEGATIVE   Bilirubin Urine NEGATIVE NEGATIVE   Ketones, ur NEGATIVE NEGATIVE mg/dL   Protein, ur NEGATIVE NEGATIVE mg/dL   Nitrite NEGATIVE NEGATIVE   Leukocytes,Ua  NEGATIVE NEGATIVE    Comment: Performed at [facility] [place], [place] [address]   MR BRAIN W WO CONTRAST  Result Date: 06/15/2019 CLINICAL DATA:  TIA.  Memory difficulty EXAM: MRI HEAD WITHOUT AND WITH CONTRAST TECHNIQUE: Multiplanar, multiecho pulse sequences of the brain and surrounding structures were obtained without and with intravenous contrast. CONTRAST:  28mL GADAVIST GADOBUTROL 1 MMOL/ML IV SOLN COMPARISON:  CT head 06/15/2019 FINDINGS: Brain: Ventricle size and cerebral volume normal. Negative for acute infarct. Few small deep white matter hyperintensities bilaterally most compatible with chronic microvascular ischemia. Brainstem and cerebellum normal. Negative for hemorrhage or mass. Normal enhancement postcontrast administration. Vascular: Normal arterial flow voids Skull and upper cervical spine: No focal skeletal lesion. Sinuses/Orbits: Negative Other: None IMPRESSION: No acute abnormality. Mild chronic microvascular ischemic change in the white matter Electronically Signed   By: Franchot Gallo M.D.   On: 06/15/2019 18:38  CT HEAD CODE STROKE WO CONTRAST  Result Date: 06/15/2019 CLINICAL DATA:  Forgetfulness, dizziness EXAM: CT HEAD WITHOUT CONTRAST TECHNIQUE: Contiguous axial images were obtained from the base of the skull through the vertex without intravenous contrast. COMPARISON:  None. FINDINGS: Brain: There is no acute intracranial hemorrhage, mass effect, or edema. Gray-white differentiation is preserved. There is no extra-axial fluid collection. Ventricles and sulci are within normal limits in size and configuration. Patchy hypoattenuation in the supratentorial white matter is nonspecific but may reflect mild chronic microvascular ischemic changes. Vascular: No hyperdense vessel.There is atherosclerotic calcification at the skull base. Skull: Calvarium is unremarkable. Sinuses/Orbits: No acute finding. Other: None. IMPRESSION: No acute intracranial hemorrhage or  evidence of acute infarction. Mild chronic microvascular ischemic changes. Electronically Signed   By: Macy Mis M.D.   On: 06/15/2019 15:53    Pending Labs Unresulted Labs (From admission, onward)    Start     Ordered   06/15/19 1714  Vitamin B12  Once,   STAT     06/15/19 1714   06/15/19 1714  Urinalysis, Routine w reflex microscopic  Once,   STAT     06/15/19 1714   Signed and Held  Hemoglobin A1c  Tomorrow morning,   R     Signed and Held   Signed and Held  Lipid panel  Tomorrow morning,   R    Comments: Fasting    Signed and Held   Signed and Held  CBC  (heparin)  Once,   R    Comments: Baseline for heparin therapy IF NOT ALREADY DRAWN.  Notify MD if PLT < 100 K.    Signed and Held   Signed and Held  Creatinine, serum  (heparin)  Once,   R    Comments: Baseline for heparin therapy IF NOT ALREADY DRAWN.    Signed and Held          Vitals/Pain Today's Vitals   06/15/19 1700 06/15/19 1710 06/15/19 1730 06/15/19 1737  BP: (!) 216/90 (!) 193/85 (!) 176/84 (!) 200/85  Pulse: 85  89 85  Resp: 12  15 (!) 25  Temp:      TempSrc:      SpO2: 97%  98% 97%  Weight:      Height:      PainSc:        Isolation Precautions No active isolations  Medications Medications  hydrALAZINE (APRESOLINE) injection 5 mg (has no administration in time range)  LORazepam (ATIVAN) injection 1 mg (1 mg Intravenous Not Given 06/15/19 1745)  hydrALAZINE (APRESOLINE) injection 5 mg (5 mg Intravenous Given 06/15/19 1710)  aspirin chewable tablet 324 mg (324 mg Oral Given 06/15/19 1837)  gadobutrol (GADAVIST) 1 MMOL/ML injection 5 mL (5 mLs Intravenous Contrast Given 06/15/19 1808)    Mobility walks Low fall risk   Focused Assessments    R Recommendations: See Admitting Provider Note  Report given to: Quinn Axe, RN  Additional Notes:

## 2019-06-15 NOTE — ED Provider Notes (Signed)
[facility] EMERGENCY DEPARTMENT Provider Note   CSN: [id number] Arrival date & time: 06/15/19  1435     History Chief Complaint  Patient presents with  . stroke symptoms    Lindsay Petty is a 79 y.o. female.  Patient with history of high blood pressure, lipids presents with memory loss that started 2:00 suddenly today.  Patient felt really tired and nauseated at the time.  Patient feels her memory has returned and it was short-term memory.  No history of stroke.  No fevers or chills.  No concerning headaches.  No blood thinner use.        Past Medical History:  Diagnosis Date  . Atropine adverse reaction    atropine and sensitivity by history  . Chest pain    cardiac catheterization 2003, no major epicardial disease, small distal vessels I  . Ejection fraction    65%.Marland Kitchenecho April 2003  . GERD (gastroesophageal reflux disease)   . Hx of cholecystectomy 03/2006   lap   . Hyperlipemia   . Hypertension    renal artery ultrasound, January, 2011, no significant renal artery stenosis,  ... medications make her feel tired  . Jaw pain    etiology uncertain  . Palpitations   . Statin intolerance    muscle weakness  . Vertigo    Mild positional vertigo    Patient Active Problem List   Diagnosis Date Noted  . Personal history of colonic polyps 10/17/2013  . Family hx of colon cancer 10/17/2013  . Guaiac positive stools 10/17/2013  . Vertigo   . Chest pain   . GERD (gastroesophageal reflux disease)   . Hyperlipemia   . Hypertension   . Ejection fraction   . Statin intolerance   . Palpitations   . ESOPHAGEAL REFLUX 04/30/2009  . OTHER MALAISE AND FATIGUE 04/30/2009    Past Surgical History:  Procedure Laterality Date  . BREAST BIOPSY     x2 for benign disease  . CARDIAC CATHETERIZATION    . CHOLECYSTECTOMY     (lap) 2008.  [name] COLONOSCOPY N/A 11/22/2013   Procedure: COLONOSCOPY;  Surgeon: Rogene Houston, MD;  Location: [facility];  Service:  Endoscopy;  Laterality: N/A;  200  . rt knee arthroscopy       OB History   No obstetric history on file.     Family History  Problem Relation Age of Onset  . Coronary artery disease Other        unknown    Social History   Tobacco Use  . Smoking status: Never Smoker  . Smokeless tobacco: Never Used  Substance Use Topics  . Alcohol use: No    Alcohol/week: 0.0 standard drinks  . Drug use: No    Home Medications Prior to Admission medications   Medication Sig Start Date End Date Taking? Authorizing Provider  ALPRAZolam Duanne Moron) 0.5 MG tablet Take 0.5 mg by mouth 2 (two) times daily as needed for anxiety.     [provider]  amLODipine (NORVASC) 5 MG tablet Take 1 tablet (5 mg total) by mouth daily. 04/04/19   Arnoldo Lenis, MD  Ascorbic Acid (VITAMIN C) 1000 MG tablet Take 1,000 mg by mouth daily.    [provider]  calcium carbonate (TUMS - DOSED IN MG ELEMENTAL CALCIUM) 500 MG chewable tablet Chew 1 tablet by mouth daily as needed.     [provider]  Cholecalciferol (VITAMIN D3) 1000 UNITS CAPS Take 1 capsule by mouth daily.  [provider]  Coenzyme Q10 (COQ10) 100 MG CAPS Take 1 capsule by mouth daily.      [provider]  esomeprazole (NEXIUM) 20 MG capsule Take 20 mg by mouth daily.     [provider]  Flaxseed, Linseed, (FLAXSEED OIL) 1000 MG CAPS Take 1 capsule by mouth daily.     [provider]  furosemide (LASIX) 40 MG tablet Take 1 tablet (40 mg total) by mouth daily as needed (swelling). 04/04/19   Arnoldo Lenis, MD  Magnesium 200 MG TABS Take 200 mg by mouth daily.     [provider]  Multiple Vitamin (MULTIVITAMIN) tablet Take 1 tablet by mouth daily.      [provider]  nadolol (CORGARD) 40 MG tablet TAKE 1 TABLET IN THE MORNING AND 1/2 TABLET IN THE EVENING. 04/04/19   Branch, [name], MD  potassium chloride SA (KLOR-CON) 20 MEQ tablet TAKE 1 TABLET BY MOUTH  ONCE DAILY. 06/12/19   Arnoldo Lenis, MD  pyridOXINE (VITAMIN B-6) 100 MG tablet Take 100 mg by mouth daily.    [provider]  zinc gluconate 50 MG tablet Take 50 mg by mouth daily.    [provider]    Allergies    Bee venom, Amoxicillin, Atropine, Doxycycline, Band-aid plus antibiotic [bacitracin-polymyxin b], and Latex  Review of Systems   Review of Systems  Constitutional: Positive for fatigue. Negative for chills and fever.  HENT: Negative for congestion.   Eyes: Negative for visual disturbance.  Respiratory: Negative for shortness of breath.   Cardiovascular: Negative for chest pain.  Gastrointestinal: Positive for nausea. Negative for abdominal pain and vomiting.  Genitourinary: Negative for dysuria and flank pain.  Musculoskeletal: Negative for back pain, neck pain and neck stiffness.  Skin: Negative for rash.  Neurological: Negative for seizures, syncope, speech difficulty, weakness, light-headedness, numbness and headaches.    Physical Exam Updated Vital Signs BP (!) 198/74 (BP Location: Left Arm)   Pulse 77   Temp 98 F (36.7 C) (Oral)   Resp 18   Ht 5' (1.524 m)   Wt 71.5 kg   SpO2 100%   BMI 30.78 kg/m   Physical Exam Vitals and nursing note reviewed.  Constitutional:      Appearance: She is well-developed.  HENT:     Head: Normocephalic and atraumatic.  Eyes:     General:        Right eye: No discharge.        Left eye: No discharge.     Conjunctiva/sclera: Conjunctivae normal.  Neck:     Trachea: No tracheal deviation.  Cardiovascular:     Rate and Rhythm: Normal rate and regular rhythm.  Pulmonary:     Effort: Pulmonary effort is normal.     Breath sounds: Normal breath sounds.  Abdominal:     General: There is no distension.     Palpations: Abdomen is soft.     Tenderness: There is no abdominal tenderness. There is no guarding.  Musculoskeletal:     Cervical back: Normal range of motion and neck supple.  Skin:     General: Skin is warm.     Findings: No rash.  Neurological:     Mental Status: She is alert and oriented to person, place, and time.     Cranial Nerves: No cranial nerve deficit.     Comments: 5+ strength in UE and LE with f/e at major joints. Sensation to palpation intact in UE and  LE. CNs 2-12 grossly intact.  EOMFI.  PERRL.   Finger nose and coordination intact bilateral.   Visual fields intact to finger testing. No nystagmus Pt identifies watch, answers questions appropriate, normal speech, short term memory improved.      ED Results / Procedures / Treatments   Labs (all labs ordered are listed, but only abnormal results are displayed) Labs Reviewed  ETHANOL  PROTIME-INR  APTT  CBC  DIFFERENTIAL  COMPREHENSIVE METABOLIC PANEL  RAPID URINE DRUG SCREEN, HOSP PERFORMED  URINALYSIS, ROUTINE W REFLEX MICROSCOPIC  I-STAT CHEM 8, ED    EKG None  Radiology No results found.  Procedures Procedures (including critical care time)  Medications Ordered in ED Medications - No data to display  ED Course  I have reviewed the triage vital signs and the nursing notes.  Pertinent labs & imaging results that were available during my care of the patient were reviewed by me and considered in my medical decision making (see chart for details).    MDM Rules/Calculators/A&P                     Patient presents with acute onset of memory loss started on 2:00 PM today and has improved significantly since then.  No focal deficits on neurologic exam.  NIH is 0 at this time. Plan for TIA/occult stroke evaluation and start with CT scan of the head, blood work pending. Patient not a TPA candidate at this time.  Blood pressure elevated on arrival will monitor.  Patient care will be signed out to reassess and follow-up blood work results and CT scan results with neuro consult.   Final Clinical Impression(s) / ED Diagnoses Final diagnoses:  Memory loss  TIA (transient ischemic attack)      Rx / DC Orders ED Discharge Orders    None       Elnora Morrison, MD 06/15/19 1454

## 2019-06-16 ENCOUNTER — Observation Stay (HOSPITAL_COMMUNITY)
Admit: 2019-06-16 | Discharge: 2019-06-16 | Disposition: A | Discharge status: Home / Self Care | Payer: Medicare Other | Attending: Internal Medicine | Admitting: Internal Medicine

## 2019-06-16 ENCOUNTER — Observation Stay (HOSPITAL_COMMUNITY): Payer: Medicare Other

## 2019-06-16 ENCOUNTER — Observation Stay (HOSPITAL_BASED_OUTPATIENT_CLINIC_OR_DEPARTMENT_OTHER): Payer: Medicare Other

## 2019-06-16 ENCOUNTER — Encounter (HOSPITAL_COMMUNITY): Payer: Self-pay | Admitting: Internal Medicine

## 2019-06-16 DIAGNOSIS — R29818 Other symptoms and signs involving the nervous system: Secondary | ICD-10-CM | POA: Diagnosis not present

## 2019-06-16 DIAGNOSIS — I10 Essential (primary) hypertension: Secondary | ICD-10-CM | POA: Diagnosis not present

## 2019-06-16 DIAGNOSIS — R002 Palpitations: Secondary | ICD-10-CM | POA: Diagnosis not present

## 2019-06-16 DIAGNOSIS — E785 Hyperlipidemia, unspecified: Secondary | ICD-10-CM | POA: Diagnosis not present

## 2019-06-16 DIAGNOSIS — G459 Transient cerebral ischemic attack, unspecified: Secondary | ICD-10-CM | POA: Diagnosis not present

## 2019-06-16 DIAGNOSIS — I6523 Occlusion and stenosis of bilateral carotid arteries: Secondary | ICD-10-CM | POA: Diagnosis not present

## 2019-06-16 LAB — LIPID PANEL
Cholesterol: 281 mg/dL — ABNORMAL HIGH (ref 0–200)
HDL: 47 mg/dL (ref >40)
LDL Cholesterol: 168 mg/dL — ABNORMAL HIGH (ref 0–99)
Total CHOL/HDL Ratio: 6 RATIO
Triglycerides: 330 mg/dL — ABNORMAL HIGH (ref <150)
VLDL: 66 mg/dL — ABNORMAL HIGH (ref 0–40)

## 2019-06-16 LAB — ECHOCARDIOGRAM COMPLETE
Height: 60 in
Weight: 2521.6 oz

## 2019-06-16 LAB — HEMOGLOBIN A1C
Hgb A1c MFr Bld: 5.3 % (ref 4.8–5.6)
Mean Plasma Glucose: 105.41 mg/dL

## 2019-06-16 MED ORDER — ROSUVASTATIN CALCIUM 10 MG PO TABS: 5.0000 mg | ORAL_TABLET | ORAL | Status: DC

## 2019-06-16 MED ORDER — ROSUVASTATIN CALCIUM 5 MG PO TABS: 5.0000 mg | ORAL_TABLET | ORAL | 2 refills | Status: DC

## 2019-06-16 MED ORDER — ASPIRIN EC 81 MG PO TBEC
81.0000 mg | DELAYED_RELEASE_TABLET | Freq: Every day | ORAL | Status: DC
Start: 2019-06-16 — End: 2022-05-11

## 2019-06-16 MED ORDER — AMLODIPINE BESYLATE 10 MG PO TABS: 10.0000 mg | ORAL_TABLET | Freq: Every day | ORAL | 2 refills | Status: DC

## 2019-06-16 NOTE — Evaluation (Signed)
Physical Therapy Evaluation Patient Details Name: Lindsay Petty MRN: [id number] DOB: 1941/01/25 Today's Date: 06/16/2019   History of Present Illness  Lindsay Petty  is a 78 y.o. female, medical history of hypertension, hyperlipidemia, palpitations secondary to PACs by cardiology, patient presents to ED secondary to complaints of memory deficits, patient stated she had an episode which lasted for approximately 20 minutes of memory loss, started around 2 PM day, report it was associated with feeling of fatigue, and nausea, he denies any focal deficits, tingling, numbness, no slurred speech, no facial droop(husband at bedside) report mainly she was forgetful for few names some family members and friends,, she denies any prior similar episodes, denies any headache, she denies any new medications, no inciting emotional events today.    Clinical Impression  Patient functioning near baseline for functional mobility and gait but does have c/o slight LE weakness from being in bed while in hospital. Patient able to complete all mobility without assist. She ambulates with decreased cadence today and fatigues after 150 feet with minimal SOB which resolves with rest. Patient educated on follow up with OPPT in future if needed is has any remaining deficits upon returning home. Patient discharged to care of nursing for ambulation daily as tolerated for length of stay.      Follow Up Recommendations No PT follow up    Equipment Recommendations  None recommended by PT    Recommendations for Other Services       Precautions / Restrictions Precautions Precautions: None Restrictions Weight Bearing Restrictions: No      Mobility  Bed Mobility Overal bed mobility: Independent             General bed mobility comments: slightly slow  Transfers Overall transfer level: Independent Equipment used: None             General transfer comment: without  AD  Ambulation/Gait Ambulation/Gait assistance: Modified independent (Device/Increase time) Gait Distance (Feet): 200 Feet Assistive device: None Gait Pattern/deviations: Step-through pattern;Decreased step length - right;Decreased step length - left;Decreased stride length Gait velocity: decreased   General Gait Details: slow, below baseline, states LE slightly weaker feeling than normal  Stairs            Wheelchair Mobility    Modified Rankin (Stroke Patients Only)       Balance Overall balance assessment: No apparent balance deficits (not formally assessed)                                           Pertinent Vitals/Pain Pain Assessment: No/denies pain    Home Living Family/patient expects to be discharged to:: Private residence Living Arrangements: Spouse/significant other Available Help at Discharge: Family;Available 24 hours/day Type of Home: House Home Access: Stairs to enter Entrance Stairs-Rails: None Entrance Stairs-Number of Steps: 1-2 Home Layout: Two level Home Equipment: None      Prior Function Level of Independence: Independent         Comments: Independent in mobility and ADLs     Hand Dominance   Dominant Hand: Right    Extremity/Trunk Assessment   Upper Extremity Assessment Upper Extremity Assessment: Defer to OT evaluation    Lower Extremity Assessment Lower Extremity Assessment: Overall WFL for tasks assessed    Cervical / Trunk Assessment Cervical / Trunk Assessment: Normal  Communication   Communication: No difficulties  Cognition Arousal/Alertness: Awake/alert Behavior During Therapy: [facility]  for tasks assessed/performed Overall Cognitive Status: Within Functional Limits for tasks assessed                                        General Comments      Exercises     Assessment/Plan    PT Assessment Patent does not need any further PT services  PT Problem List         PT  Treatment Interventions      PT Goals (Current goals can be found in the Care Plan section)  Acute Rehab PT Goals Patient Stated Goal: Return home with husband PT Goal Formulation: With patient Time For Goal Achievement: 06/16/19 Potential to Achieve Goals: Good    Frequency     Barriers to discharge        Co-evaluation               AM-PAC PT "6 Clicks" Mobility  Outcome Measure Help needed turning from your back to your side while in a flat bed without using bedrails?: None Help needed moving from lying on your back to sitting on the side of a flat bed without using bedrails?: None Help needed moving to and from a bed to a chair (including a wheelchair)?: None Help needed standing up from a chair using your arms (e.g., wheelchair or bedside chair)?: None Help needed to walk in hospital room?: None Help needed climbing 3-5 steps with a railing? : A Little 6 Click Score: 23    End of Session Equipment Utilized During Treatment: Gait belt Activity Tolerance: Patient tolerated treatment well Patient left: in bed;with family/visitor present;with call bell/phone within reach Nurse Communication: Mobility status PT Visit Diagnosis: Unsteadiness on feet (R26.81);Other abnormalities of gait and mobility (R26.89);Muscle weakness (generalized) (M62.81)    Time: [id number] PT Time Calculation (min) (ACUTE ONLY): 11 min   Charges:   PT Evaluation $PT Eval Low Complexity: 1 Low          1:55 PM, 06/16/19 Lindsay Petty PT, DPT Physical Therapist at [facility]

## 2019-06-16 NOTE — Plan of Care (Signed)
  Problem: Education: Goal: Knowledge of General Education information will improve Description: Including pain rating scale, medication(s)/side effects and non-pharmacologic comfort measures Outcome: Completed/Met   Problem: Health Behavior/Discharge Planning: Goal: Ability to manage health-related needs will improve Outcome: Completed/Met   Problem: Clinical Measurements: Goal: Ability to maintain clinical measurements within normal limits will improve Outcome: Completed/Met Goal: Will remain free from infection Outcome: Completed/Met Goal: Diagnostic test results will improve Outcome: Completed/Met Goal: Respiratory complications will improve Outcome: Completed/Met Goal: Cardiovascular complication will be avoided Outcome: Completed/Met   Problem: Activity: Goal: Risk for activity intolerance will decrease Outcome: Completed/Met   Problem: Nutrition: Goal: Adequate nutrition will be maintained Outcome: Completed/Met   Problem: Coping: Goal: Level of anxiety will decrease Outcome: Completed/Met   Problem: Elimination: Goal: Will not experience complications related to bowel motility Outcome: Completed/Met Goal: Will not experience complications related to urinary retention Outcome: Completed/Met   Problem: Pain Managment: Goal: General experience of comfort will improve Outcome: Completed/Met   Problem: Safety: Goal: Ability to remain free from injury will improve Outcome: Completed/Met   Problem: Skin Integrity: Goal: Risk for impaired skin integrity will decrease Outcome: Completed/Met   Problem: Education: Goal: Knowledge of disease or condition will improve Outcome: Completed/Met Goal: Knowledge of secondary prevention will improve Outcome: Completed/Met Goal: Knowledge of patient specific risk factors addressed and post discharge goals established will improve Outcome: Completed/Met

## 2019-06-16 NOTE — Evaluation (Signed)
Occupational Therapy Evaluation Patient Details Name: Lindsay Petty MRN: [id number] DOB: [birth year] Today's Date: 06/16/2019    History of Present Illness Lindsay Petty  is a 79 y.o. female, medical history of hypertension, hyperlipidemia, palpitations secondary to PACs by cardiology, patient presents to ED secondary to complaints of memory deficits, patient stated she had an episode which lasted for approximately 20 minutes of memory loss, started around 2 PM day, report it was associated with feeling of fatigue, and nausea, he denies any focal deficits, tingling, numbness, no slurred speech, no facial droop(husband at bedside) report mainly she was forgetful for few names some family members and friends,, she denies any prior similar episodes, denies any headache, she denies any new medications, no inciting emotional events today.   Clinical Impression   Pt agreeable to OT evaluation, husband present. Pt is performing ADLs and functional mobility independently. Reports cognitive issues have resolved. Pt is at baseline, no further OT services required at this time.     Follow Up Recommendations  No OT follow up    Equipment Recommendations  None recommended by OT       Precautions / Restrictions Precautions Precautions: None Restrictions Weight Bearing Restrictions: No      Mobility Bed Mobility Overal bed mobility: Independent                Transfers Overall transfer level: Independent Equipment used: None                      ADL either performed or assessed with clinical judgement   ADL Overall ADL's : Independent                                             Vision Baseline Vision/History: Wears glasses Wears Glasses: At all times Patient Visual Report: No change from baseline Vision Assessment?: No apparent visual deficits            Pertinent Vitals/Pain Pain Assessment: No/denies pain     Hand Dominance  Right   Extremity/Trunk Assessment Upper Extremity Assessment Upper Extremity Assessment: Overall WFL for tasks assessed(RUE weakness due to RC tear)   Lower Extremity Assessment Lower Extremity Assessment: Defer to PT evaluation   Cervical / Trunk Assessment Cervical / Trunk Assessment: Normal   Communication Communication Communication: No difficulties   Cognition Arousal/Alertness: Awake/alert Behavior During Therapy: WFL for tasks assessed/performed Overall Cognitive Status: Within Functional Limits for tasks assessed                                                Home Living Family/patient expects to be discharged to:: Private residence Living Arrangements: Spouse/significant other Available Help at Discharge: Family;Available 24 hours/day Type of Home: House Home Access: Stairs to enter [employer] of Steps: 1-2 Entrance Stairs-Rails: None Home Layout: Two level     Bathroom Shower/Tub: Teacher, early years/pre: Standard     Home Equipment: None          Prior Functioning/Environment Level of Independence: Independent        Comments: Independent in mobility and ADLs               AM-PAC OT "6 Clicks" Daily Activity  Outcome Measure Help from another person eating meals?: None Help from another person taking care of personal grooming?: None Help from another person toileting, which includes using toliet, bedpan, or urinal?: None Help from another person bathing (including washing, rinsing, drying)?: None Help from another person to put on and taking off regular upper body clothing?: None Help from another person to put on and taking off regular lower body clothing?: None 6 Click Score: 24   End of Session Nurse Communication: Mobility status  Activity Tolerance: Patient tolerated treatment well Patient left: in bed;with call bell/phone within reach;with family/visitor present  OT Visit Diagnosis:  Muscle weakness (generalized) (M62.81)                Time: [id number] OT Time Calculation (min): 10 min Charges:  OT General Charges $OT Visit: 1 Visit OT Evaluation $OT Eval Low Complexity: [place], OTR/L  [phone] 06/16/2019, 7:58 AM

## 2019-06-16 NOTE — Progress Notes (Signed)
EEG complete - results pending 

## 2019-06-16 NOTE — Progress Notes (Signed)
SLP Cancellation Note  Patient Details Name: Lindsay Petty MRN: [id number] DOB: March 28, 1940   Cancelled treatment:       Reason Eval/Treat Not Completed: SLP screened, no needs identified, will sign off. All presenting symptoms have resolved at this time, Thank you for this referral.  Riely Oetken H. Roddie Mc, CCC-SLP Speech Language Pathologist    Wende Bushy 06/16/2019, 10:30 AM

## 2019-06-16 NOTE — Discharge Summary (Signed)
Physician Discharge Summary  Lindsay Petty [id number] DOB: [birth year] DOA: 06/15/2019  PCP: Berenice Primas  Admit date: 06/15/2019 Discharge date: 06/16/2019  Admitted From: Home  Disposition: Home   Recommendations for Outpatient Follow-up:  1. Follow up with PCP in 1 weeks 2. Follow up with Dr. Merlene Laughter Neurology in 2 weeks  3. Trial of STATIN: crestor 5 mg three times weekly   Discharge Condition: STABLE   CODE STATUS: FULL    Brief Hospitalization Summary: Please see all hospital notes, images, labs for full details of the hospitalization. ADMISSION HPI:  Lindsay Petty  is a 79 y.o. female, medical history of hypertension, hyperlipidemia, palpitations secondary to PACs by cardiology, patient presents to ED secondary to complaints of memory deficits, patient stated she had an episode which lasted for approximately 20 minutes of memory loss, started around 2 PM day, report it was associated with feeling of fatigue, and nausea, he denies any focal deficits, tingling, numbness, no slurred speech, no facial droop(husband at bedside) report mainly she was forgetful for few names some family members and friends,, she denies any prior similar episodes, denies any headache, she denies any new medications, no inciting emotional events today.  -In ED work-up was significant for uncontrolled blood pressure with systolic being in the [id number], she reports usually her blood pressures controlled at home, CT head with no acute findings, no significant lab abnormalities, teleneurologist were consulted who recommended admission for MRI and EEG and transglobal amnesia work-up.    The patient was admitted for episodes of transient memory disturbance and transient amnesia that had resolved prior to admission.  She was seen by the telemetry neurology service and they recommended a TIA work-up.  B12 and TSH levels within normal limits.  Lipid panel reveals markedly elevated total cholesterol  and LDL.  Patient reports previous intolerance to atorvastatin.  We are going to try Crestor 5 mg 3 times weekly and titrate outpatient as tolerated.  Goal LDL less than 70.  Patient was started on aspirin 81 mg daily.  Patient had carotid Doppler studies that were showing no significant stenosis.  The patient is feeling better and she has returned to her baseline and she would like to go home.  She had a 2D echocardiogram the final results are pending but she will follow-up with her PCP.  I will make an outpatient appointment recommendation for her to see the neurologist Dr. Merlene Laughter in 2 to 3 weeks for follow-up.  Her EEG study was completed and showed no findings of epileptic activity.  Her blood pressure is suboptimally controlled.  We have increased her amlodipine to 10 mg daily.  She needs outpatient follow-up for blood pressure management with her PCP.   Discharge Diagnoses:  Active Problems:   Esophageal reflux   GERD (gastroesophageal reflux disease)   Hyperlipemia   Hypertension   Palpitations   Transient neurologic deficit   Dyslipidemia   Discharge Instructions:  Allergies as of 06/16/2019      Reactions   Bee Venom Anaphylaxis   Amoxicillin Hives   Atropine    Heart race    Doxycycline Swelling   Band-aid Plus Antibiotic [bacitracin-polymyxin B] Rash   Latex Rash      Medication List    TAKE these medications   ALPRAZolam 0.5 MG tablet Commonly known as: XANAX Take 0.5 mg by mouth 2 (two) times daily as needed for anxiety.   amLODipine 5 MG tablet Commonly known as: NORVASC Take 1 tablet (5 mg total) by  mouth daily.   aspirin EC 81 MG tablet Take 1 tablet (81 mg total) by mouth daily.   calcium carbonate 500 MG chewable tablet Commonly known as: TUMS - dosed in mg elemental calcium Chew 1 tablet by mouth daily as needed.   CoQ10 100 MG Caps Take 1 capsule by mouth daily.   esomeprazole 20 MG capsule Commonly known as: NEXIUM Take 20 mg by mouth daily.    Flaxseed Oil 1000 MG Caps Take 1 capsule by mouth daily.   furosemide 40 MG tablet Commonly known as: Lasix Take 1 tablet (40 mg total) by mouth daily as needed (swelling).   Magnesium 200 MG Tabs Take 200 mg by mouth daily.   multivitamin tablet Take 1 tablet by mouth daily.   nadolol 40 MG tablet Commonly known as: CORGARD TAKE 1 TABLET IN THE MORNING AND 1/2 TABLET IN THE EVENING.   potassium chloride SA 20 MEQ tablet Commonly known as: KLOR-CON TAKE 1 TABLET BY MOUTH ONCE DAILY.   pyridOXINE 100 MG tablet Commonly known as: VITAMIN B-6 Take 100 mg by mouth daily.   rosuvastatin 5 MG tablet Commonly known as: CRESTOR Take 1 tablet (5 mg total) by mouth 3 (three) times a week.   Systane Complete 0.6 % Soln Generic drug: Propylene Glycol Place 1 drop into both eyes in the morning and at bedtime.   vitamin C 1000 MG tablet Take 1,000 mg by mouth daily.   Vitamin D3 25 MCG (1000 UT) Caps Take 1 capsule by mouth daily.   zinc gluconate 50 MG tablet Take 50 mg by mouth daily.      Follow-up Information    Berenice Primas. Schedule an appointment as soon as possible for a visit on 06/22/2019.   Specialty: Nurse Practitioner Why: 11:45 Contact information: [address] [place] [address] [phone]        Arnoldo Lenis, MD. Schedule an appointment as soon as possible for a visit on 07/10/2019.   Specialty: Cardiology Why: 10:30 Contact information: [place] [address] [phone]        Phillips Odor, MD. Schedule an appointment as soon as possible for a visit in 2 week(s).   Specialty: Neurology Why: Hospital Follow up for TIA Contact information: [address] [place] [address] [phone]          Allergies  Allergen Reactions  . Bee Venom Anaphylaxis  . Amoxicillin Hives  . Atropine     Heart race   . Doxycycline Swelling  . Band-Aid Plus Antibiotic [Bacitracin-Polymyxin B] Rash  .  Latex Rash   Allergies as of 06/16/2019      Reactions   Bee Venom Anaphylaxis   Amoxicillin Hives   Atropine    Heart race    Doxycycline Swelling   Band-aid Plus Antibiotic [bacitracin-polymyxin B] Rash   Latex Rash      Medication List    TAKE these medications   ALPRAZolam 0.5 MG tablet Commonly known as: XANAX Take 0.5 mg by mouth 2 (two) times daily as needed for anxiety.   amLODipine 5 MG tablet Commonly known as: NORVASC Take 1 tablet (5 mg total) by mouth daily.   aspirin EC 81 MG tablet Take 1 tablet (81 mg total) by mouth daily.   calcium carbonate 500 MG chewable tablet Commonly known as: TUMS - dosed in mg elemental calcium Chew 1 tablet by mouth daily as needed.   CoQ10 100 MG Caps Take 1  capsule by mouth daily.   esomeprazole 20 MG capsule Commonly known as: NEXIUM Take 20 mg by mouth daily.   Flaxseed Oil 1000 MG Caps Take 1 capsule by mouth daily.   furosemide 40 MG tablet Commonly known as: Lasix Take 1 tablet (40 mg total) by mouth daily as needed (swelling).   Magnesium 200 MG Tabs Take 200 mg by mouth daily.   multivitamin tablet Take 1 tablet by mouth daily.   nadolol 40 MG tablet Commonly known as: CORGARD TAKE 1 TABLET IN THE MORNING AND 1/2 TABLET IN THE EVENING.   potassium chloride SA 20 MEQ tablet Commonly known as: KLOR-CON TAKE 1 TABLET BY MOUTH ONCE DAILY.   pyridOXINE 100 MG tablet Commonly known as: VITAMIN B-6 Take 100 mg by mouth daily.   rosuvastatin 5 MG tablet Commonly known as: CRESTOR Take 1 tablet (5 mg total) by mouth 3 (three) times a week.   Systane Complete 0.6 % Soln Generic drug: Propylene Glycol Place 1 drop into both eyes in the morning and at bedtime.   vitamin C 1000 MG tablet Take 1,000 mg by mouth daily.   Vitamin D3 25 MCG (1000 UT) Caps Take 1 capsule by mouth daily.   zinc gluconate 50 MG tablet Take 50 mg by mouth daily.       Procedures/Studies: MR BRAIN W WO CONTRAST  Result  Date: 06/15/2019 CLINICAL DATA:  TIA.  Memory difficulty EXAM: MRI HEAD WITHOUT AND WITH CONTRAST TECHNIQUE: Multiplanar, multiecho pulse sequences of the brain and surrounding structures were obtained without and with intravenous contrast. CONTRAST:  99mL GADAVIST GADOBUTROL 1 MMOL/ML IV SOLN COMPARISON:  CT head 06/15/2019 FINDINGS: Brain: Ventricle size and cerebral volume normal. Negative for acute infarct. Few small deep white matter hyperintensities bilaterally most compatible with chronic microvascular ischemia. Brainstem and cerebellum normal. Negative for hemorrhage or mass. Normal enhancement postcontrast administration. Vascular: Normal arterial flow voids Skull and upper cervical spine: No focal skeletal lesion. Sinuses/Orbits: Negative Other: None IMPRESSION: No acute abnormality. Mild chronic microvascular ischemic change in the white matter Electronically Signed   By: Franchot Gallo M.D.   On: 06/15/2019 18:38   US Carotid Bilateral (at [facility] and [facility] only)  Result Date: 06/16/2019 CLINICAL DATA:  79 year old female with a history of TIA EXAM: BILATERAL CAROTID DUPLEX ULTRASOUND TECHNIQUE: Pearline Cables scale imaging, color Doppler and duplex ultrasound were performed of bilateral carotid and vertebral arteries in the neck. COMPARISON:  None. FINDINGS: Criteria: Quantification of carotid stenosis is based on velocity parameters that correlate the residual internal carotid diameter with NASCET-based stenosis levels, using the diameter of the distal internal carotid lumen as the denominator for stenosis measurement. The following velocity measurements were obtained: RIGHT ICA:  Systolic 123456 cm/sec, Diastolic 23 cm/sec CCA:  78 cm/sec SYSTOLIC ICA/CCA RATIO:  1.3 ECA:  100 cm/sec LEFT ICA:  Systolic 89 cm/sec, Diastolic 21 cm/sec CCA:  79 cm/sec SYSTOLIC ICA/CCA RATIO:  1.1 ECA:  76 cm/sec Right Brachial SBP: Not acquired Left Brachial SBP: Not acquired RIGHT CAROTID ARTERY: No significant calcified disease of  the right common carotid artery. Intermediate waveform maintained. Homogeneous plaque without significant calcifications at the right carotid bifurcation. Low resistance waveform of the right ICA. No significant tortuosity. RIGHT VERTEBRAL ARTERY: Antegrade flow with low resistance waveform. LEFT CAROTID ARTERY: No significant calcified disease of the left common carotid artery. Intermediate waveform maintained. Homogeneous plaque at the left carotid bifurcation without significant calcifications. Low resistance waveform of the left ICA. LEFT VERTEBRAL ARTERY:  Antegrade flow with low resistance waveform. IMPRESSION: Color duplex indicates minimal homogeneous plaque, with no hemodynamically significant stenosis by duplex criteria in the extracranial cerebrovascular circulation. Signed, Dulcy Fanny. Dellia Nims, RPVI [facility] Electronically Signed   By: Corrie Mckusick D.O.   On: 06/16/2019 11:23   EEG adult  Result Date: 06/16/2019 Lora Havens, MD     06/16/2019 10:37 AM Patient Name: Lindsay Petty MRN: [id number] Epilepsy Attending: Lora Havens Referring Physician/Provider: Dr. Phillips Climes Date: 06/16/2019 Duration: 25.16 minutes Patient history: 79 year old female with transient memory disturbance. EEG to evaluate for seizures. Level of alertness: Awake, asleep AEDs during EEG study: Xanax Technical aspects: This EEG study was done with scalp electrodes positioned according to the 10-20 International system of electrode placement. Electrical activity was acquired at a sampling rate of 500Hz  and reviewed with a high frequency filter of 70Hz  and a low frequency filter of 1Hz . EEG data were recorded continuously and digitally stored. Description: The posterior dominant rhythm consists of 9-10 Hz activity of moderate voltage (25-35 uV) seen predominantly in posterior head regions, symmetric and reactive to eye opening and eye closing.  Sleep was characterized by vertex waves, sleep spindles (12 to 14 Hz), maximal frontocentral region. EEG also showed an excessive amount of 15 to 18 Hz, 2-3 uV beta activity with irregular morphology distributed symmetrically and diffusely. No EEG change was seen during hyperventilation. Physiologic photic driving was seen during photic stimulation. Abnormality -Excessive beta, generalized IMPRESSION: This study is within normal limits. No seizures or epileptiform discharges were seen throughout the recording. The excessive beta activity seen in the background is most likely due to the effect of benzodiazepine and is a benign EEG pattern. Lora Havens   CT HEAD CODE STROKE WO CONTRAST  Result Date: 06/15/2019 CLINICAL DATA:  Forgetfulness, dizziness EXAM: CT HEAD WITHOUT CONTRAST TECHNIQUE: Contiguous axial images were obtained from the base of the skull through the vertex without intravenous contrast. COMPARISON:  None. FINDINGS: Brain: There is no acute intracranial hemorrhage, mass effect, or edema. Gray-white differentiation is preserved. There is no extra-axial fluid collection. Ventricles and sulci are within normal limits in size and configuration. Patchy hypoattenuation in the supratentorial white matter is nonspecific but may reflect mild chronic microvascular ischemic changes. Vascular: No hyperdense vessel.There is atherosclerotic calcification at the skull base. Skull: Calvarium is unremarkable. Sinuses/Orbits: No acute finding. Other: None. IMPRESSION: No acute intracranial hemorrhage or evidence of acute infarction. Mild chronic microvascular ischemic changes. Electronically Signed   By: Macy Mis M.D.   On: 06/15/2019 15:53      Subjective: Pt says that her symptoms are resolved.   Discharge Exam: Vitals:   06/16/19 0649 06/16/19 0840  BP: (!) 175/69 (!) 155/65  Pulse: 68 67  Resp: 19 18  Temp:  98.3 F (36.8 C)  SpO2: 98% 97%   Vitals:   06/16/19 0247 06/16/19 0417  06/16/19 0649 06/16/19 0840  BP: (!) 145/66 (!) 155/69 (!) 175/69 (!) 155/65  Pulse: 69 70 68 67  Resp: 16 16 19 18   Temp: 98 F (36.7 C) 98.8 F (37.1 C)  98.3 F (36.8 C)  TempSrc: Oral Oral    SpO2: 98% 98% 98% 97%  Weight:      Height:       General: Pt is alert, awake, not in acute distress Cardiovascular: RRR, S1/S2 +, no rubs, no gallops Respiratory: CTA bilaterally, no wheezing, no rhonchi Abdominal: Soft, NT, ND, bowel sounds +  Extremities: no edema, no cyanosis Neurological: nonfocal exam   The results of significant diagnostics from this hospitalization (including imaging, microbiology, ancillary and laboratory) are listed below for reference.     Microbiology: No results found for this or any previous visit (from the past 240 hour(s)).   Labs: BNP (last 3 results) No results for input(s): BNP in the last 8760 hours. Basic Metabolic Panel: Recent Labs  Lab 06/15/19 1501 06/15/19 1504  NA 136 141  K 3.6 3.9  CL 101 105  CO2 25  --   GLUCOSE 112* 114*  BUN 15 15  CREATININE 0.95 1.20*  CALCIUM 9.4  --    Liver Function Tests: Recent Labs  Lab 06/15/19 1501  AST 32  ALT 35  ALKPHOS 96  BILITOT 0.4  PROT 7.8  ALBUMIN 4.4   No results for input(s): LIPASE, AMYLASE in the last 168 hours. No results for input(s): AMMONIA in the last 168 hours. CBC: Recent Labs  Lab 06/15/19 1501 06/15/19 1504  WBC 5.6  --   NEUTROABS 3.7  --   HGB 13.6 13.6  HCT 41.6 40.0  MCV 95.6  --   PLT 287  --    Cardiac Enzymes: No results for input(s): CKTOTAL, CKMB, CKMBINDEX, TROPONINI in the last 168 hours. BNP: Invalid input(s): POCBNP CBG: Recent Labs  Lab 06/15/19 1455  GLUCAP 112*   D-Dimer No results for input(s): DDIMER in the last 72 hours. Hgb A1c Recent Labs    06/15/19 1501  HGBA1C 5.3   Lipid Profile Recent Labs    06/16/19 0654  CHOL 281*  HDL 47  LDLCALC 168*  TRIG 330*  CHOLHDL 6.0   Thyroid function studies Recent Labs     06/15/19 1502  TSH 3.881   Anemia work up Recent Labs    06/15/19 1502  VITAMINB12 2,155*   Urinalysis    Component Value Date/Time   COLORURINE STRAW (A) 06/15/2019 1600   APPEARANCEUR CLEAR 06/15/2019 1600   LABSPEC 1.003 (L) 06/15/2019 1600   PHURINE 7.0 06/15/2019 1600   GLUCOSEU NEGATIVE 06/15/2019 1600   HGBUR NEGATIVE 06/15/2019 1600   BILIRUBINUR NEGATIVE 06/15/2019 1600   KETONESUR NEGATIVE 06/15/2019 1600   PROTEINUR NEGATIVE 06/15/2019 1600   NITRITE NEGATIVE 06/15/2019 1600   LEUKOCYTESUR NEGATIVE 06/15/2019 1600   Sepsis Labs Invalid input(s): PROCALCITONIN,  WBC,  LACTICIDVEN Microbiology No results found for this or any previous visit (from the past 240 hour(s)).  Time coordinating discharge:   SIGNED:  Irwin Brakeman, MD  [facility] 06/16/2019, 2:24 PM How to contact the [facility] Attending or Consulting provider [place] or covering provider during after hours [place], for this patient?  1. Check the care team in [facility] and look for a) attending/consulting [facility] provider listed and b) the [facility] team listed 2. Log into [email or web address] and use [place]'s universal password to access. If you do not have the password, please contact the hospital operator. 3. Locate the [facility] provider you are looking for under [facility] and page to a number that you can be directly reached. 4. If you still have difficulty reaching the provider, please page the [facility] (Director on Call) for the Hospitalists listed on amion for assistance.

## 2019-06-16 NOTE — Progress Notes (Signed)
PT Cancellation Note  Patient Details Name: Lindsay Petty MRN: [id number] DOB: 1940-12-18   Cancelled Treatment:    Reason Eval/Treat Not Completed: Patient at procedure or test/unavailable; Patient having procedure/test performed, will check back later.  12:31 PM, 06/16/19 Mearl Latin PT, DPT Physical Therapist at [facility]

## 2019-06-16 NOTE — Progress Notes (Signed)
*  PRELIMINARY RESULTS* Echocardiogram 2D Echocardiogram has been performed.  Lindsay Petty 06/16/2019, 1:02 PM

## 2019-06-16 NOTE — Procedures (Signed)
Patient Name: Lindsay Petty  MRN: [id number]  Epilepsy Attending: Lora Havens  Referring Physician/Provider: Dr. Phillips Climes Date: 06/16/2019 Duration: 25.16 minutes  Patient history: 79 year old female with transient memory disturbance. EEG to evaluate for seizures.  Level of alertness: Awake, asleep  AEDs during EEG study: Xanax  Technical aspects: This EEG study was done with scalp electrodes positioned according to the 10-20 International system of electrode placement. Electrical activity was acquired at a sampling rate of 500Hz  and reviewed with a high frequency filter of 70Hz  and a low frequency filter of 1Hz . EEG data were recorded continuously and digitally stored.   Description: The posterior dominant rhythm consists of 9-10 Hz activity of moderate voltage (25-35 uV) seen predominantly in posterior head regions, symmetric and reactive to eye opening and eye closing. Sleep was characterized by vertex waves, sleep spindles (12 to 14 Hz), maximal frontocentral region. EEG also showed an excessive amount of 15 to 18 Hz, 2-3 uV beta activity with irregular morphology distributed symmetrically and diffusely. No EEG change was seen during hyperventilation. Physiologic photic driving was seen during photic stimulation.  Abnormality -Excessive beta, generalized  IMPRESSION: This study is within normal limits. No seizures or epileptiform discharges were seen throughout the recording.  The excessive beta activity seen in the background is most likely due to the effect of benzodiazepine and is a benign EEG pattern.  Quavion Boule Barbra Sarks

## 2019-06-16 NOTE — Discharge Instructions (Addendum)
Transient Ischemic Attack  A transient ischemic attack (TIA) is a "warning stroke" that causes stroke-like symptoms that go away quickly. A TIA does not cause lasting damage to the brain. But having a TIA is a sign that you may be at risk for a stroke. Lifestyle changes and medical treatments can help prevent a stroke. It is important to know the symptoms of a TIA and what to do. Get help right away, even if your symptoms go away. The symptoms of a TIA are the same as those of a stroke. They can happen fast, and they usually go away within minutes or hours. They can include:  Weakness or loss of feeling in your face, arm, or leg. This often happens on one side of your body.  Trouble walking.  Trouble moving your arms or legs.  Trouble talking or understanding what people are saying.  Trouble seeing.  Seeing two of one object (double vision).  Feeling dizzy.  Feeling confused.  Loss of balance or coordination.  Feeling sick to your stomach (nauseous) and throwing up (vomiting).  A very bad headache for no reason. What increases the risk? Certain things may make you more likely to have a TIA. Some of these are things that you can change, such as:  Being very overweight (obese).  Using products that contain nicotine or tobacco, such as cigarettes and e-cigarettes.  Taking birth control pills.  Not being active.  Drinking too much alcohol.  Using drugs. Other risk factors include:  Having an irregular heartbeat (atrial fibrillation).  Being African American or Hispanic.  Having had blood clots, stroke, TIA, or heart attack in the past.  Being a woman with a history of high blood pressure in pregnancy (preeclampsia).  Being over the age of 60.  Being female.  Having family history of stroke.  Having the following diseases or conditions: ? High blood pressure. ? High cholesterol. ? Diabetes. ? Heart disease. ? Sickle cell disease. ? Sleep apnea. ? Migraine  headache. ? Long-term (chronic) diseases that cause soreness and swelling (inflammation). ? Disorders that affect how your blood clots. Follow these instructions at home: Medicines   Take over-the-counter and prescription medicines only as told by your doctor.  If you were told to take aspirin or another medicine to thin your blood, take it exactly as told by your doctor. ? Taking too much of the medicine can cause bleeding. ? Taking too little of the medicine may not work to treat the problem. Eating and drinking   Eat 5 or more servings of fruits and vegetables each day.  Follow instructions from your doctor about your diet. You may need to follow a certain diet to help lower your risk of having a stroke. You may need to: ? Eat a diet that is low in fat and salt. ? Eat foods that contain a lot of fiber. ? Limit the amount of carbohydrates and sugar in your diet.  Limit alcohol intake to 1 drink a day for nonpregnant women and 2 drinks a day for men. One drink equals 12 oz of beer, 5 oz of wine, or 1 oz of hard liquor. General instructions  Keep a healthy weight.  Stay active. Try to get at least 30 minutes of activity on all or most days.  Find out if you have a condition called sleep apnea. Get treatment if needed.  Do not use any products that contain nicotine or tobacco, such as cigarettes and e-cigarettes. If you need help quitting,   ask your doctor.  Do not abuse drugs.  Keep all follow-up visits as told by your doctor. This is important. Get help right away if:  You have any signs of stroke. "BE FAST" is an easy way to remember the main warning signs: ? B - Balance. Signs are dizziness, sudden trouble walking, or loss of balance. ? E - Eyes. Signs are trouble seeing or a sudden change in how you see. ? F - Face. Signs are sudden weakness or loss of feeling of the face, or the face or eyelid drooping on one side. ? A - Arms. Signs are weakness or loss of feeling in an  arm. This happens suddenly and usually on one side of the body. ? S - Speech. Signs are sudden trouble speaking, slurred speech, or trouble understanding what people say. ? T - Time. Time to call emergency services. Write down what time symptoms started.  You have other signs of stroke, such as: ? A sudden, very bad headache with no known cause. ? Feeling sick to your stomach (nausea). ? Throwing up (vomiting). ? Jerky movements that you cannot control (seizure). These symptoms may be an emergency. Do not wait to see if the symptoms will go away. Get medical help right away. Call your local emergency services (911 in the [place] Do not drive yourself to the hospital. Summary  A transient ischemic attack (TIA) is a "warning stroke" that causes stroke-like symptoms that go away quickly.  A TIA is a medical emergency. Get help right away, even if your symptoms go away.  A TIA does not cause lasting damage to the brain.  Having a TIA is a sign that you may be at risk for a stroke. Lifestyle changes and medical treatments can help prevent a stroke. This information is not intended to replace advice given to you by your health care provider. Make sure you discuss any questions you have with your health care provider. Document Revised: 10/29/2017 Document Reviewed: 05/06/2016 Elsevier Patient Education  [place].    IMPORTANT INFORMATION: PAY CLOSE ATTENTION   PHYSICIAN DISCHARGE INSTRUCTIONS  Follow with Primary care provider  Lindsay Petty  and other consultants as instructed by your Hospitalist Physician  [place] IF SYMPTOMS COME BACK, WORSEN OR NEW PROBLEM DEVELOPS   Please note: You were cared for by a hospitalist during your hospital stay. Every effort will be made to forward records to your primary care provider.  You can request that your primary care provider send for your hospital records if they have not received them.  Once  you are discharged, your primary care physician will handle any further medical issues. Please note that NO REFILLS for any discharge medications will be authorized once you are discharged, as it is imperative that you return to your primary care physician (or establish a relationship with a primary care physician if you do not have one) for your post hospital discharge needs so that they can reassess your need for medications and monitor your lab values.  Please get a complete blood count and chemistry panel checked by your Primary MD at your next visit, and again as instructed by your Primary MD.  Get Medicines reviewed and adjusted: Please take all your medications with you for your next visit with your Primary MD  Laboratory/radiological data: Please request your Primary MD to go over all hospital tests and procedure/radiological results at the follow up, please ask your primary care  provider to get all Hospital records sent to his/her office.  In some cases, they will be blood work, cultures and biopsy results pending at the time of your discharge. Please request that your primary care provider follow up on these results.  If you are diabetic, please bring your blood sugar readings with you to your follow up appointment with primary care.    Please call and make your follow up appointments as soon as possible.    Also Note the following: If you experience worsening of your admission symptoms, develop shortness of breath, life threatening emergency, suicidal or homicidal thoughts you must seek medical attention immediately by calling 911 or calling your MD immediately  if symptoms less severe.  You must read complete instructions/literature along with all the possible adverse reactions/side effects for all the Medicines you take and that have been prescribed to you. Take any new Medicines after you have completely understood and accpet all the possible adverse reactions/side effects.   Do  not drive when taking Pain medications or sleeping medications (Benzodiazepines)  Do not take more than prescribed Pain, Sleep and Anxiety Medications. It is not advisable to combine anxiety,sleep and pain medications without talking with your primary care practitioner  Special Instructions: If you have smoked or chewed Tobacco  in the last 2 yrs please stop smoking, stop any regular Alcohol  and or any Recreational drug use.  Wear Seat belts while driving.  Do not drive if taking any narcotic, mind altering or controlled substances or recreational drugs or alcohol.

## 2019-06-23 DIAGNOSIS — G459 Transient cerebral ischemic attack, unspecified: Secondary | ICD-10-CM | POA: Diagnosis not present

## 2019-06-23 DIAGNOSIS — I10 Essential (primary) hypertension: Secondary | ICD-10-CM | POA: Diagnosis not present

## 2019-06-23 DIAGNOSIS — Z09 Encounter for follow-up examination after completed treatment for conditions other than malignant neoplasm: Secondary | ICD-10-CM | POA: Diagnosis not present

## 2019-06-23 DIAGNOSIS — N1831 Chronic kidney disease, stage 3a: Secondary | ICD-10-CM | POA: Diagnosis not present

## 2019-06-23 DIAGNOSIS — Z299 Encounter for prophylactic measures, unspecified: Secondary | ICD-10-CM | POA: Diagnosis not present

## 2019-07-09 NOTE — Progress Notes (Signed)
Cardiology Office Note  Date: 07/10/2019   ID: [name], [name] [birth year], MRN [id number]  PCP:  Berenice Primas  Cardiologist:  Carlyle Dolly, MD Electrophysiologist:  None   Chief Complaint: F/U HTN, Hx of CP, Palpitations, LE  History of Present Illness: Lindsay Petty is a 79 y.o. female with a history of HTN, Hx of CP, Palpitations, LE  CP: Cath 2003 no significant disease : small vessel disease. ASA caused bleeding in the past. No recent CP.  Palpitations : event monitor 2018 symptoms a/w SR and PACS. Nadolol increased. Rare palpitations. HTN: amlodipine decreased d/t LE edema. HLD: diet controlled. Statin intolerant followed by PCP LE edema.echo 2017 EF 60-65% Grade 1 DD. Improved LE edema after decrease of Amlodipine and Chlorthalidone.  Last encounter with Dr. Harl Bowie via telemedicine. No recent CP/ CAD symptoms. Palpitations: mild rare symptoms, stress related, better with benzos. Chlorthalidone dc'd  based on recent renal labs and prn Lasix started.  Recent hospital admission for memory loss.  She was admitted for overnight observation and discharged the next day.  States she has had no recurrences.  States she had CT and MRI of her head which showed no evidence of stroke or other disease.  She had an echocardiogram and carotid study as well.  She states that the only incidence where she had issues of memory loss.  She denies any anginal or exertional symptoms, palpitations or arrhythmias, current CVA or TIA-like symptoms, orthostatic symptoms, PND or orthopnea, bleeding issues, DVT or PE-like symptoms, or lower extremity edema.  States she has been doing much better off the chlorthalidone and taking as needed Lasix.  He has occasional palpitations which are transient and relieves mostly with benzodiazepines which she takes as needed.  Blood pressure is elevated today.  However patient states her blood pressures are usually in the 130s over to 160s at  home.  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   Past Medical History:  Diagnosis Date  . Atropine adverse reaction    atropine and sensitivity by history  . Chest pain    cardiac catheterization 2003, no major epicardial disease, small distal vessels I  . Ejection fraction    65%.Marland Kitchenecho April 2003  . GERD (gastroesophageal reflux disease)   . Hx of cholecystectomy 03/2006   lap   . Hyperlipemia   . Hypertension    renal artery ultrasound, January, 2011, no significant renal artery stenosis,  ... medications make her feel tired  . Jaw pain    etiology uncertain  . Palpitations   . Statin intolerance    muscle weakness  . Vertigo    Mild positional vertigo    Past Surgical History:  Procedure Laterality Date  . BREAST BIOPSY     x2 for benign disease  . CARDIAC CATHETERIZATION    . CHOLECYSTECTOMY     (lap) 2008.  [name] COLONOSCOPY N/A 11/22/2013   Procedure: COLONOSCOPY;  Surgeon: Rogene Houston, MD;  Location: [facility];  Service: Endoscopy;  Laterality: N/A;  200  . rt knee arthroscopy      Current Outpatient Medications  Medication Sig Dispense Refill  . ALPRAZolam (XANAX) 0.5 MG tablet Take 0.5 mg by mouth 2 (two) times daily as needed for anxiety.     [name] amLODipine (NORVASC) 10 MG tablet Take 1 tablet (10 mg total) by mouth daily. 90 tablet 2  . Ascorbic Acid (VITAMIN C) 1000 MG tablet Take 1,000 mg by mouth daily.    [name]  aspirin EC 81 MG tablet Take 1 tablet (81 mg total) by mouth daily.    . calcium carbonate (TUMS - DOSED IN MG ELEMENTAL CALCIUM) 500 MG chewable tablet Chew 1 tablet by mouth daily as needed.     . Cholecalciferol (VITAMIN D3) 1000 UNITS CAPS Take 1 capsule by mouth daily.     . Coenzyme Q10 (COQ10) 100 MG CAPS Take 1 capsule by mouth daily.      [name] esomeprazole (NEXIUM) 20 MG capsule Take 20 mg by mouth daily.     . Flaxseed, Linseed, (FLAXSEED OIL) 1000 MG CAPS Take 1 capsule by mouth daily.     . furosemide (LASIX) 40 MG tablet Take 1 tablet  (40 mg total) by mouth daily as needed (swelling). 90 tablet 1  . Magnesium 200 MG TABS Take 200 mg by mouth daily.     . Multiple Vitamin (MULTIVITAMIN) tablet Take 1 tablet by mouth daily.      . nadolol (CORGARD) 40 MG tablet TAKE 1 TABLET IN THE MORNING AND 1/2 TABLET IN THE EVENING. 135 tablet 2  . potassium chloride SA (KLOR-CON) 20 MEQ tablet TAKE 1 TABLET BY MOUTH ONCE DAILY. 30 tablet 6  . Propylene Glycol (SYSTANE COMPLETE) 0.6 % SOLN Place 1 drop into both eyes in the morning and at bedtime.    . pyridOXINE (VITAMIN B-6) 100 MG tablet Take 100 mg by mouth daily.    . rosuvastatin (CRESTOR) 5 MG tablet Take 1 tablet (5 mg total) by mouth 3 (three) times a week. 12 tablet 2  . zinc gluconate 50 MG tablet Take 50 mg by mouth daily.     No current facility-administered medications for this visit.   Allergies:  Bee venom, Amoxicillin, Atropine, Doxycycline, Band-aid plus antibiotic [bacitracin-polymyxin b], and Latex   Social History: The patient  reports that she has never smoked. She has never used smokeless tobacco. She reports that she does not drink alcohol or use drugs.   Family History: The patient's family history includes Coronary artery disease in an other family member.   ROS:  Please see the history of present illness. Otherwise, complete review of systems is positive for none.  All other systems are reviewed and negative.   Physical Exam: VS:  BP (!) 162/64   Pulse 64   Ht 5' (1.524 m)   Wt 154 lb 6.4 oz (70 kg)   SpO2 97%   BMI 30.15 kg/m , BMI Body mass index is 30.15 kg/m.  Wt Readings from Last 3 Encounters:  07/10/19 154 lb 6.4 oz (70 kg)  06/15/19 157 lb 9.6 oz (71.5 kg)  04/04/19 151 lb (68.5 kg)    General: Patient appears comfortable at rest. Neck: Supple, no elevated JVP or carotid bruits, no thyromegaly. Lungs: Clear to auscultation, nonlabored breathing at rest. Cardiac: Regular rate and rhythm, no S3 or significant systolic murmur, no pericardial  rub. Extremities: No pitting edema, distal pulses 2+. Skin: Warm and dry. Musculoskeletal: No kyphosis. Neuropsychiatric: Alert and oriented x3, affect grossly appropriate.  ECG:    Recent Labwork: 04/18/2019: Magnesium 2.0 06/15/2019: ALT 35; AST 32; BUN 15; Creatinine, Ser 1.20; Hemoglobin 13.6; Platelets 287; Potassium 3.9; Sodium 141; TSH 3.881     Component Value Date/Time   CHOL 281 (H) 06/16/2019 0654   TRIG 330 (H) 06/16/2019 0654   HDL 47 06/16/2019 0654   CHOLHDL 6.0 06/16/2019 0654   VLDL 66 (H) 06/16/2019 0654   LDLCALC 168 (H) 06/16/2019 [id number]  Other Studies Reviewed Today:   MRI of the head 06/15/2019 IMPRESSION: No acute abnormality. Mild chronic microvascular ischemic change in the white matter    CT of head without contrast 06/15/2019 IMPRESSION: No acute intracranial hemorrhage or evidence of acute infarction. Mild chronic microvascular ischemic changes.    Carotid artery duplex study 06/16/2019 IMPRESSION: Color duplex indicates minimal homogeneous plaque, with no hemodynamically significant stenosis by duplex criteria in the extracranial cerebrovascular circulation.   Echocardiogram 06/16/2019 Left ventricular ejection fraction, by estimation, is 65 to 70%. The left ventricle has hyperdynamic function. The left ventricle has no regional wall motion abnormalities. Left ventricular diastolic parameters are consistent with Grade I diastolic dysfunction (impaired relaxation). 2. Right ventricular systolic function is normal. The right ventricular size is normal. 3. Left atrial size was mildly dilated. 4. The mitral valve is normal in structure. No evidence of mitral valve regurgitation. No evidence of mitral stenosis. 5. The aortic valve is tricuspid. Aortic valve regurgitation is not visualized. No aortic stenosis is present. 6. The inferior vena cava is normal in size with greater than 50% respiratory variability, suggesting right atrial  pressure of 3 mmHg.   05/2001 cath ANGIOGRAPHIC DATA: 1. Ventriculography was performed in the RAO projection. Overall left  ventricular function was preserved and no segmental abnormalities,  contraction were identified. Ejection fraction was calculated at 61%. I  could not appreciate significant mitral regurgitation.  2. The left main coronary artery appeared free of critical disease.  3. The left anterior descending artery coursed to the apex. There were four  small diagonal branches. The LAD as well as the other terminal branches of  the coronary artery all tapered rather quickly and were fairly small in  caliber.  4. There is a small ramus that was free of critical disease.  5. There was a circumflex that provided two marginals that appeared to be free  of significant disease. Again, the vessels terminated distally as small  vessels.  6. The right coronary artery was a dominant vessel. There was a small acute  marginal branch in terms of caliber. There was a moderate sized  posterolateral branch. All of these were without obvious focal narrowing.  CONCLUSIONS: 1. Preserved left ventricular function. 2. 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  Feb 25 2015 clinic EKG (performed and reviewed): NSR  Jan 2017 echo Study Conclusions  - Left ventricle: The cavity size was normal. Wall thickness was at the upper limits of normal. Systolic function was vigorous. The estimated ejection fraction was in the range of 60% to 65%. Wall motion was normal; there were no regional wall motion abnormalities. Doppler parameters are consistent with abnormal left ventricular relaxation (grade 1 diastolic dysfunction). Normal filling pressures. - Mitral valve: There was trivial regurgitation. - Right atrium: Central venous pressure (est): 3 mm Hg. - Tricuspid valve: There  was trivial regurgitation. - Pulmonary arteries: PA peak pressure: 27 mm Hg (S). - Pericardium, extracardiac: There was no pericardial effusion.  Impressions:  - Upper normal LV wall thickness with LVEF 60-65%. Grade 1 diastolic dysfunction with normal filling pressures. Trivial mitral and tricuspid regurgitation. Normal PASP 27 mmHg.  04/2016 Event monitor  Telemetry tracings show sinus rhythm with occasional PACs  Reported symptoms correlate to NSR with PACs.  No significant arrhythmias   Assessment and Plan:  1. Other chest pain   2. Palpitations   3. Essential hypertension   4. Bilateral leg edema    1. Other  chest pain Currently denies any anginal or exertional symptoms.  Continue aspirin 81 mg daily.  Continue Crestor 5 mg daily.  2. Palpitations States she has occasional, sporadic, short-lived palpitations and relieved mostly with benzodiazepine use.  Continue nadolol 40 mg 1/2 pill daily  3. Essential hypertension Blood pressure is elevated today at 162/64.  Patient states blood pressures at home runs in the 130s over 60s.  States she has whitecoat hypertension.  Continue amlodipine 10 mg,  4. Bilateral leg edema No evidence of lower extremity edema.  Patient states her edema has been better since she stopped the HCTZ.  She only takes as needed Lasix.  Continue Lasix 40 mg as needed.  Continue potassium 20 mEq daily.  Medication Adjustments/Labs and Tests Ordered: Current medicines are reviewed at length with the patient today.  Concerns regarding medicines are outlined above.   Disposition: Follow-up with Dr. Harl Bowie or APP 6 months  Signed, Levell July, NP 07/10/2019 11:10 AM    [facility] [place], [place], [place] [address] Phone: [phone]; Fax: [phone]

## 2019-07-10 ENCOUNTER — Encounter: Payer: Self-pay | Admitting: Family Medicine

## 2019-07-10 ENCOUNTER — Ambulatory Visit (INDEPENDENT_AMBULATORY_CARE_PROVIDER_SITE_OTHER): Payer: Medicare Other | Admitting: Family Medicine

## 2019-07-10 ENCOUNTER — Other Ambulatory Visit: Payer: Self-pay

## 2019-07-10 VITALS — BP 162/64 | HR 64 | Ht 60.0 in | Wt 154.4 lb

## 2019-07-10 DIAGNOSIS — I10 Essential (primary) hypertension: Secondary | ICD-10-CM | POA: Diagnosis not present

## 2019-07-10 DIAGNOSIS — R0789 Other chest pain: Secondary | ICD-10-CM | POA: Diagnosis not present

## 2019-07-10 DIAGNOSIS — R002 Palpitations: Secondary | ICD-10-CM | POA: Diagnosis not present

## 2019-07-10 DIAGNOSIS — R600 Localized edema: Secondary | ICD-10-CM

## 2019-07-10 MED ORDER — AMLODIPINE BESYLATE 10 MG PO TABS: 10.0000 mg | ORAL_TABLET | Freq: Every day | ORAL | 2 refills | Status: DC

## 2019-07-10 NOTE — Patient Instructions (Signed)
Medication Instructions:    Your physician recommends that you continue on your current medications as directed. Please refer to the Current Medication list given to you today.  Labwork:  NONE  Testing/Procedures:  NONE  Follow-Up:  Your physician recommends that you schedule a follow-up appointment in: 6 months (office)  Any Other Special Instructions Will Be Listed Below (If Applicable).  If you need a refill on your cardiac medications before your next appointment, please call your pharmacy. 

## 2019-07-13 DIAGNOSIS — L821 Other seborrheic keratosis: Secondary | ICD-10-CM | POA: Diagnosis not present

## 2019-07-13 DIAGNOSIS — L570 Actinic keratosis: Secondary | ICD-10-CM | POA: Diagnosis not present

## 2019-07-13 DIAGNOSIS — L718 Other rosacea: Secondary | ICD-10-CM | POA: Diagnosis not present

## 2019-08-11 ENCOUNTER — Other Ambulatory Visit: Payer: Self-pay | Admitting: Cardiology

## 2019-09-27 DIAGNOSIS — Z6831 Body mass index (BMI) 31.0-31.9, adult: Secondary | ICD-10-CM | POA: Diagnosis not present

## 2019-09-27 DIAGNOSIS — N183 Chronic kidney disease, stage 3 unspecified: Secondary | ICD-10-CM | POA: Diagnosis not present

## 2019-09-27 DIAGNOSIS — Z0000 Encounter for general adult medical examination without abnormal findings: Secondary | ICD-10-CM | POA: Diagnosis not present

## 2019-09-27 DIAGNOSIS — I10 Essential (primary) hypertension: Secondary | ICD-10-CM | POA: Diagnosis not present

## 2019-09-27 DIAGNOSIS — E7800 Pure hypercholesterolemia, unspecified: Secondary | ICD-10-CM | POA: Diagnosis not present

## 2019-09-27 DIAGNOSIS — Z1331 Encounter for screening for depression: Secondary | ICD-10-CM | POA: Diagnosis not present

## 2019-09-27 DIAGNOSIS — Z7189 Other specified counseling: Secondary | ICD-10-CM | POA: Diagnosis not present

## 2019-09-27 DIAGNOSIS — Z299 Encounter for prophylactic measures, unspecified: Secondary | ICD-10-CM | POA: Diagnosis not present

## 2019-09-27 DIAGNOSIS — Z1339 Encounter for screening examination for other mental health and behavioral disorders: Secondary | ICD-10-CM | POA: Diagnosis not present

## 2019-09-27 DIAGNOSIS — R5383 Other fatigue: Secondary | ICD-10-CM | POA: Diagnosis not present

## 2020-01-18 ENCOUNTER — Ambulatory Visit (INDEPENDENT_AMBULATORY_CARE_PROVIDER_SITE_OTHER): Payer: Medicare Other | Admitting: Cardiology

## 2020-01-18 ENCOUNTER — Encounter: Payer: Self-pay | Admitting: *Deleted

## 2020-01-18 ENCOUNTER — Encounter: Payer: Self-pay | Admitting: Cardiology

## 2020-01-18 VITALS — BP 180/84 | HR 65 | Ht 60.0 in | Wt 157.0 lb

## 2020-01-18 DIAGNOSIS — R002 Palpitations: Secondary | ICD-10-CM

## 2020-01-18 DIAGNOSIS — R0789 Other chest pain: Secondary | ICD-10-CM | POA: Diagnosis not present

## 2020-01-18 DIAGNOSIS — E785 Hyperlipidemia, unspecified: Secondary | ICD-10-CM | POA: Diagnosis not present

## 2020-01-18 MED ORDER — EZETIMIBE 10 MG PO TABS
10.0000 mg | ORAL_TABLET | Freq: Every day | ORAL | 1 refills | Status: DC
Start: 2020-01-18 — End: 2020-08-06

## 2020-01-18 NOTE — Progress Notes (Signed)
Clinical Summary Ms. Cudmore is a 79 y.o.female  seen today for follow up of the following medical problems.   1. History of chest pain - history of prior chest pain symptoms. Cath in 2003 without significant disease, she had small distal vessel disease  - bleeding with ASA in the past   -- denies any recent chest pains.    2. Palpitations - 04/2016 event monitor showed symptoms correlated with SR and PACs. We increased nadolol to 40mg  in am and 20mg  in pm   - rare palpitations, overall doing well on nadolol   3. HTN -previously had increased on higher norvasc dose, improved on lower dose however back on norvasc 10 without recurrent issues.    - compliant with meds. Has had some issues with low K on chlorthalidone, some uptrend in Cr. It was stopped previously.  - cough previously on lisinopril in the past   4. Hyperlipidemia - statin intolerance  - TC 281 TG 330 HDL 47 LDL 168  5. LE edema - echo 02/2015 LVEF 16-10%, grade I diastolic dysfunction - controlled with just prn lasix.    SH: . Keeps her grandkids 37 and 98 years old.Has one great grandchild 8 months    Past Medical History:  Diagnosis Date  . Atropine adverse reaction    atropine and sensitivity by history  . Chest pain    cardiac catheterization 2003, no major epicardial disease, small distal vessels I  . Ejection fraction    65%.Marland Kitchenecho April 2003  . GERD (gastroesophageal reflux disease)   . Hx of cholecystectomy 03/2006   lap   . Hyperlipemia   . Hypertension    renal artery ultrasound, January, 2011, no significant renal artery stenosis,  ... medications make her feel tired  . Jaw pain    etiology uncertain  . Palpitations   . Statin intolerance    muscle weakness  . Vertigo    Mild positional vertigo     Allergies  Allergen Reactions  . Bee Venom Anaphylaxis  . Amoxicillin Hives  . Atropine     Heart race   . Doxycycline Swelling  . Band-Aid Plus  Antibiotic [Bacitracin-Polymyxin B] Rash  . Latex Rash     Current Outpatient Medications  Medication Sig Dispense Refill  . ALPRAZolam (XANAX) 0.5 MG tablet Take 0.5 mg by mouth 2 (two) times daily as needed for anxiety.     [name] amLODipine (NORVASC) 10 MG tablet Take 1 tablet (10 mg total) by mouth daily. 90 tablet 2  . Ascorbic Acid (VITAMIN C) 1000 MG tablet Take 1,000 mg by mouth daily.    [name] aspirin EC 81 MG tablet Take 1 tablet (81 mg total) by mouth daily.    . calcium carbonate (TUMS - DOSED IN MG ELEMENTAL CALCIUM) 500 MG chewable tablet Chew 1 tablet by mouth daily as needed.     . Cholecalciferol (VITAMIN D3) 1000 UNITS CAPS Take 1 capsule by mouth daily.     . Coenzyme Q10 (COQ10) 100 MG CAPS Take 1 capsule by mouth daily.      [name] esomeprazole (NEXIUM) 20 MG capsule Take 20 mg by mouth daily.     . Flaxseed, Linseed, (FLAXSEED OIL) 1000 MG CAPS Take 1 capsule by mouth daily.     . furosemide (LASIX) 40 MG tablet Take 1 tablet (40 mg total) by mouth daily as needed (swelling). 90 tablet 1  . Magnesium 200 MG TABS Take 200 mg by mouth daily.     [name]  Multiple Vitamin (MULTIVITAMIN) tablet Take 1 tablet by mouth daily.      . nadolol (CORGARD) 40 MG tablet TAKE 1 TABLET IN THE MORNING AND 1/2 TABLET IN THE EVENING. 135 tablet 0  . potassium chloride SA (KLOR-CON) 20 MEQ tablet TAKE 1 TABLET BY MOUTH ONCE DAILY. 30 tablet 6  . Propylene Glycol (SYSTANE COMPLETE) 0.6 % SOLN Place 1 drop into both eyes in the morning and at bedtime.    . pyridOXINE (VITAMIN B-6) 100 MG tablet Take 100 mg by mouth daily.    . rosuvastatin (CRESTOR) 5 MG tablet Take 1 tablet (5 mg total) by mouth 3 (three) times a week. 12 tablet 2  . zinc gluconate 50 MG tablet Take 50 mg by mouth daily.     No current facility-administered medications for this visit.     Past Surgical History:  Procedure Laterality Date  . BREAST BIOPSY     x2 for benign disease  . CARDIAC CATHETERIZATION    . CHOLECYSTECTOMY      (lap) 2008.  [name] COLONOSCOPY N/A 11/22/2013   Procedure: COLONOSCOPY;  Surgeon: Rogene Houston, MD;  [facility];  Service: Endoscopy;  Laterality: N/A;  200  . rt knee arthroscopy       Allergies  Allergen Reactions  . Bee Venom Anaphylaxis  . Amoxicillin Hives  . Atropine     Heart race   . Doxycycline Swelling  . Band-Aid Plus Antibiotic [Bacitracin-Polymyxin B] Rash  . Latex Rash      Family History  Problem Relation Age of Onset  . Coronary artery disease Other        unknown     Social History Ms. Gonder reports that she has never smoked. She has never used smokeless tobacco. Ms. Sharrar reports no history of alcohol use.   Review of Systems CONSTITUTIONAL: No weight loss, fever, chills, weakness or fatigue.  HEENT: Eyes: No visual loss, blurred vision, double vision or yellow sclerae.No hearing loss, sneezing, congestion, runny nose or sore throat.  SKIN: No rash or itching.  CARDIOVASCULAR: per hpi RESPIRATORY: No shortness of breath, cough or sputum.  GASTROINTESTINAL: No anorexia, nausea, vomiting or diarrhea. No abdominal pain or blood.  GENITOURINARY: No burning on urination, no polyuria NEUROLOGICAL: No headache, dizziness, syncope, paralysis, ataxia, numbness or tingling in the extremities. No change in bowel or bladder control.  MUSCULOSKELETAL: No muscle, back pain, joint pain or stiffness.  LYMPHATICS: No enlarged nodes. No history of splenectomy.  PSYCHIATRIC: No history of depression or anxiety.  ENDOCRINOLOGIC: No reports of sweating, cold or heat intolerance. No polyuria or polydipsia.  [name]   Physical Examination Today's Vitals   01/18/20 1305  BP: (!) 180/84  Pulse: 65  SpO2: 97%  Weight: 157 lb (71.2 kg)  Height: 5' (1.524 m)   Body mass index is 30.66 kg/m.  Gen: resting comfortably, no acute distress HEENT: no scleral icterus, pupils equal round and reactive, no palptable cervical adenopathy,  CV: RRR, no m/r/g no  jvd Resp: Clear to auscultation bilaterally GI: abdomen is soft, non-tender, non-distended, normal bowel sounds, no hepatosplenomegaly MSK: extremities are warm, no edema.  Skin: warm, no rash Neuro:  no focal deficits Psych: appropriate affect   Diagnostic Studies 05/2001 cath ANGIOGRAPHIC DATA: 1. Ventriculography was performed in the RAO projection. Overall left  ventricular function was preserved and no segmental abnormalities,  contraction were identified. Ejection fraction was calculated at 61%. I  could not appreciate significant mitral regurgitation.  2.  The left main coronary artery appeared free of critical disease.  3. The left anterior descending artery coursed to the apex. There were four  small diagonal branches. The LAD as well as the other terminal branches of  the coronary artery all tapered rather quickly and were fairly small in  caliber.  4. There is a small ramus that was free of critical disease.  5. There was a circumflex that provided two marginals that appeared to be free  of significant disease. Again, the vessels terminated distally as small  vessels.  6. The right coronary artery was a dominant vessel. There was a small acute  marginal Isahi Godwin in terms of caliber. There was a moderate sized  posterolateral Chantea Surace. All of these were without obvious focal narrowing.  CONCLUSIONS: 1. Preserved left ventricular function. 2. 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  Feb 25 2015 clinic EKG (performed and reviewed): NSR  Jan 2017 echo Study Conclusions  - Left ventricle: The cavity size was normal. Wall thickness was at the upper limits of normal. Systolic function was vigorous. The estimated ejection fraction was in the range of 60% to 65%. Wall motion was normal; there were no regional wall motion abnormalities. Doppler parameters are  consistent with abnormal left ventricular relaxation (grade 1 diastolic dysfunction). Normal filling pressures. - Mitral valve: There was trivial regurgitation. - Right atrium: Central venous pressure (est): 3 mm Hg. - Tricuspid valve: There was trivial regurgitation. - Pulmonary arteries: PA peak pressure: 27 mm Hg (S). - Pericardium, extracardiac: There was no pericardial effusion.  Impressions:  - Upper normal LV wall thickness with LVEF 60-65%. Grade 1 diastolic dysfunction with normal filling pressures. Trivial mitral and tricuspid regurgitation. Normal PASP 27 mmHg.  04/2016 Event monitor  Telemetry tracings show sinus rhythm with occasional PACs  Reported symptoms correlate to NSR with PACs.  No significant arrhythmias    Assessment and Plan   1. Chest pain - previous cath with minimal disease. - denies recent symptoms, continue current meds  2. Palpitations -overall controlled, continue nadolol  3. HTN -home bps 130s to 150s, working on weight loss and dietary changes. Recent stress with passing of her brother. - monitor at this time, if needs additioanla gent would try losartan.     4. LE edema - controlled, continue prn lasix.      Arnoldo Lenis, M.D.

## 2020-01-18 NOTE — Patient Instructions (Signed)
Your physician wants you to follow-up in: [place] will receive a reminder letter in the mail two months in advance. If you don't receive a letter, please call our office to schedule the follow-up appointment.  Your physician has recommended you make the following change in your medication:   START ZETIA 10 [place]   Your physician recommends that you return for lab work CMP/LIPIDS - PLEASE FAST 6-8 HOURS HOURS PRIOR TO LAB WORK   Thank you for choosing [facility]!!

## 2020-02-01 DIAGNOSIS — N1831 Chronic kidney disease, stage 3a: Secondary | ICD-10-CM | POA: Diagnosis not present

## 2020-02-01 DIAGNOSIS — F419 Anxiety disorder, unspecified: Secondary | ICD-10-CM | POA: Diagnosis not present

## 2020-02-01 DIAGNOSIS — Z299 Encounter for prophylactic measures, unspecified: Secondary | ICD-10-CM | POA: Diagnosis not present

## 2020-02-01 DIAGNOSIS — Z713 Dietary counseling and surveillance: Secondary | ICD-10-CM | POA: Diagnosis not present

## 2020-02-01 DIAGNOSIS — I10 Essential (primary) hypertension: Secondary | ICD-10-CM | POA: Diagnosis not present

## 2020-02-15 DIAGNOSIS — I10 Essential (primary) hypertension: Secondary | ICD-10-CM | POA: Diagnosis not present

## 2020-03-01 ENCOUNTER — Other Ambulatory Visit: Payer: Self-pay | Admitting: Cardiology

## 2020-03-01 DIAGNOSIS — R002 Palpitations: Secondary | ICD-10-CM | POA: Diagnosis not present

## 2020-03-01 DIAGNOSIS — E785 Hyperlipidemia, unspecified: Secondary | ICD-10-CM | POA: Diagnosis not present

## 2020-03-02 LAB — LIPID PANEL
Cholesterol: 247 mg/dL — ABNORMAL HIGH (ref <200)
HDL: 51 mg/dL (ref >=50)
LDL Cholesterol (Calc): 154 mg/dL (calc) — ABNORMAL HIGH
Non-HDL Cholesterol (Calc): 196 mg/dL (calc) — ABNORMAL HIGH (ref <130)
Total CHOL/HDL Ratio: 4.8 (calc) (ref <5.0)
Triglycerides: 258 mg/dL — ABNORMAL HIGH (ref <150)

## 2020-03-02 LAB — COMPLETE METABOLIC PANEL WITH GFR
AG Ratio: 1.5 (calc) (ref 1.0–2.5)
ALT: 20 U/L (ref 6–29)
AST: 22 U/L (ref 10–35)
Albumin: 4 g/dL (ref 3.6–5.1)
Alkaline phosphatase (APISO): 87 U/L (ref 37–153)
BUN/Creatinine Ratio: 18 (calc) (ref 6–22)
BUN: 17 mg/dL (ref 7–25)
CO2: 27 mmol/L (ref 20–32)
Calcium: 9.8 mg/dL (ref 8.6–10.4)
Chloride: 104 mmol/L (ref 98–110)
Creat: 0.92 mg/dL — ABNORMAL HIGH (ref 0.60–0.88)
GFR, Est African American: 68 mL/min/{1.73_m2} (ref >=60)
GFR, Est Non African American: 58 mL/min/{1.73_m2} — ABNORMAL LOW (ref >=60)
Globulin: 2.6 g/dL (calc) (ref 1.9–3.7)
Glucose, Bld: 90 mg/dL (ref 65–99)
Potassium: 4.3 mmol/L (ref 3.5–5.3)
Sodium: 141 mmol/L (ref 135–146)
Total Bilirubin: 0.5 mg/dL (ref 0.2–1.2)
Total Protein: 6.6 g/dL (ref 6.1–8.1)

## 2020-03-15 ENCOUNTER — Telehealth: Payer: Self-pay | Admitting: *Deleted

## 2020-03-15 DIAGNOSIS — E785 Hyperlipidemia, unspecified: Secondary | ICD-10-CM

## 2020-03-15 NOTE — Telephone Encounter (Signed)
Patient informed. Copy sent to PCP °

## 2020-03-15 NOTE — Telephone Encounter (Signed)
-----   Message from Arnoldo Lenis, MD sent at 03/15/2020 11:28 AM EST ----- Cholesterol too high, can we refer her to lipid clinic to help manage her high cholesterol numbers   Zandra Abts MD

## 2020-03-18 DIAGNOSIS — I10 Essential (primary) hypertension: Secondary | ICD-10-CM | POA: Diagnosis not present

## 2020-03-27 ENCOUNTER — Other Ambulatory Visit: Payer: Self-pay | Admitting: Cardiology

## 2020-04-05 ENCOUNTER — Telehealth: Payer: Self-pay | Admitting: Cardiology

## 2020-04-05 MED ORDER — NADOLOL 40 MG PO TABS: ORAL_TABLET | ORAL | 1 refills | Status: DC

## 2020-04-05 MED ORDER — AMLODIPINE BESYLATE 5 MG PO TABS: 5.0000 mg | ORAL_TABLET | Freq: Two times a day (BID) | ORAL | 1 refills | Status: DC

## 2020-04-05 NOTE — Telephone Encounter (Signed)
Done

## 2020-04-05 NOTE — Telephone Encounter (Signed)
New message     Needs prescription changed to 5mg  2x a day instead of having her cut the pills in half    *STAT* If patient is at the pharmacy, call can be transferred to refill team.   1. Which medications need to be refilled? (please list name of each medication and dose if known) amLODipine (NORVASC) 10 MG tablet  2. Which pharmacy/location (including street and city if local pharmacy) is medication to be sent to? [place]  3. Do they need a 30 day or 90 day supply?  [place]

## 2020-04-15 DIAGNOSIS — I10 Essential (primary) hypertension: Secondary | ICD-10-CM | POA: Diagnosis not present

## 2020-05-16 DIAGNOSIS — I10 Essential (primary) hypertension: Secondary | ICD-10-CM | POA: Diagnosis not present

## 2020-06-14 DIAGNOSIS — I10 Essential (primary) hypertension: Secondary | ICD-10-CM | POA: Diagnosis not present

## 2020-06-26 ENCOUNTER — Encounter: Payer: Self-pay | Admitting: Internal Medicine

## 2020-06-26 ENCOUNTER — Telehealth (INDEPENDENT_AMBULATORY_CARE_PROVIDER_SITE_OTHER): Payer: Medicare Other | Admitting: Internal Medicine

## 2020-06-26 VITALS — BP 123/69 | Ht 59.0 in | Wt 151.0 lb

## 2020-06-26 DIAGNOSIS — T466X5A Adverse effect of antihyperlipidemic and antiarteriosclerotic drugs, initial encounter: Secondary | ICD-10-CM | POA: Diagnosis not present

## 2020-06-26 DIAGNOSIS — M791 Myalgia, unspecified site: Secondary | ICD-10-CM | POA: Diagnosis not present

## 2020-06-26 DIAGNOSIS — I6523 Occlusion and stenosis of bilateral carotid arteries: Secondary | ICD-10-CM

## 2020-06-26 DIAGNOSIS — Z8673 Personal history of transient ischemic attack (TIA), and cerebral infarction without residual deficits: Secondary | ICD-10-CM | POA: Diagnosis not present

## 2020-06-26 DIAGNOSIS — E785 Hyperlipidemia, unspecified: Secondary | ICD-10-CM | POA: Diagnosis not present

## 2020-06-26 NOTE — Patient Instructions (Signed)
Medication Instructions:  RESUME zetia 10mg  daily  *If you need a refill on your cardiac medications before your next appointment, please call your pharmacy*   Lab Work: FASTING lab work in 3 months - can be checked in [facility]  If you have labs (blood work) drawn today and your tests are completely normal, you will receive your results only by: [name] MyChart Message (if you have MyChart) OR . A paper copy in the mail If you have any lab test that is abnormal or we need to change your treatment, we will call you to review the results.   Testing/Procedures: NONE   Follow-Up: At [facility], you and your health needs are our priority.  As part of our continuing mission to provide you with exceptional heart care, we have created designated Provider Care Teams.  These Care Teams include your primary Cardiologist (physician) and Advanced Practice Providers (APPs -  Physician Assistants and Nurse Practitioners) who all work together to provide you with the care you need, when you need it.  We recommend signing up for the patient portal called "MyChart".  Sign up information is provided on this After Visit Summary.  MyChart is used to connect with patients for Virtual Visits (Telemedicine).  Patients are able to view lab/test results, encounter notes, upcoming appointments, etc.  Non-urgent messages can be sent to your provider as well.   To learn more about what you can do with MyChart, go to [email or web address].    Your next appointment:   AS NEEDED with Dr. Debara Pickett   If your labs are not at target, Dr. Debara Pickett can see you back in follow up to discuss other treatment options

## 2020-06-26 NOTE — Progress Notes (Addendum)
Virtual Visit via Telephone Note   This visit type was conducted due to national recommendations for restrictions regarding the COVID-19 Pandemic (e.g. social distancing) in an effort to limit this patient's exposure and mitigate transmission in our community.  Due to her co-morbid illnesses, this patient is at least at moderate risk for complications without adequate follow up.  This format is felt to be most appropriate for this patient at this time.  The patient did not have access to video technology/had technical difficulties with video requiring transitioning to audio format only (telephone).  All issues noted in this document were discussed and addressed.  No physical exam could be performed with this format.  Please refer to the patient's chart for her  consent to telehealth for [facility].   Date:  06/26/2020   ID:  Lindsay Petty, DOB Jul 08, 1940, MRN [id number] The patient was identified using 2 identifiers.  Evaluation Performed:  New Patient Evaluation  Patient Location:  [place] [address]  Provider location:   [address] [place], [place] [address]  PCP:  Berenice Primas  Cardiologist:  Carlyle Dolly, MD Electrophysiologist:  None   Chief Complaint:  Manage dyslipidemia  History of Present Illness:    Lindsay Petty is a 80 y.o. female who presents via audio/video conferencing for a telehealth visit today for the evaluation of dyslipidemia at the request of Branch, [name], MD. This is a 80 year old female kindly referred for evaluation management of dyslipidemia.  She had possible TIA in the past.  She was found to have some mild carotid disease and has some renal artery stenosis.  She also has a history of hypertension and cardiac catheterization dating back to 2003 with some mild coronary disease.  Her cholesterols always been high but she has had side effects including statin intolerance that caused  myalgias.  More recently she has been on ezetimibe but said she stopped that also due to some side effects.  We discussed some other options including injectable therapy but she was very hesitant to this and then said she wanted to consider going back on ezetimibe.  She also wanted some dietary information as well.  PMHx:  Past Medical History:  Diagnosis Date  . Atropine adverse reaction    atropine and sensitivity by history  . Chest pain    cardiac catheterization 2003, no major epicardial disease, small distal vessels I  . Ejection fraction    65%.Marland Kitchenecho April 2003  . GERD (gastroesophageal reflux disease)   . Hx of cholecystectomy 03/2006   lap   . Hyperlipemia   . Hypertension    renal artery ultrasound, January, 2011, no significant renal artery stenosis,  ... medications make her feel tired  . Jaw pain    etiology uncertain  . Palpitations   . Statin intolerance    muscle weakness  . Vertigo    Mild positional vertigo    Past Surgical History:  Procedure Laterality Date  . BREAST BIOPSY     x2 for benign disease  . CARDIAC CATHETERIZATION    . CHOLECYSTECTOMY     (lap) 2008.  [name] COLONOSCOPY N/A 11/22/2013   Procedure: COLONOSCOPY;  Surgeon: Rogene Houston, MD;  [facility];  Service: Endoscopy;  Laterality: N/A;  200  . rt knee arthroscopy      FAMHx:  Family History  Problem Relation Age of Onset  . Coronary artery disease Other  unknown    SOCHx:   reports that she has never smoked. She has never used smokeless tobacco. She reports that she does not drink alcohol and does not use drugs.  ALLERGIES:  Allergies  Allergen Reactions  . Bee Venom Anaphylaxis  . Amoxicillin Hives  . Atropine     Heart race   . Doxycycline Swelling  . Band-Aid Plus Antibiotic [Bacitracin-Polymyxin B] Rash  . Latex Rash    ROS: Pertinent items noted in HPI and remainder of comprehensive ROS otherwise negative.  HOME MEDS: Current Outpatient Medications  on File Prior to Visit  Medication Sig Dispense Refill  . ALPRAZolam (XANAX) 0.5 MG tablet Take 0.5 mg by mouth 2 (two) times daily as needed for anxiety.     [name] amLODipine (NORVASC) 5 MG tablet Take 1 tablet (5 mg total) by mouth 2 (two) times daily. 180 tablet 1  . Ascorbic Acid (VITAMIN C) 1000 MG tablet Take 1,000 mg by mouth daily.    [name] aspirin EC 81 MG tablet Take 1 tablet (81 mg total) by mouth daily.    . calcium carbonate (TUMS - DOSED IN MG ELEMENTAL CALCIUM) 500 MG chewable tablet Chew 1 tablet by mouth daily as needed.     . Cholecalciferol (VITAMIN D3) 1000 UNITS CAPS Take 1 capsule by mouth daily.    . Coenzyme Q10 (COQ10) 100 MG CAPS Take 1 capsule by mouth daily.      [name] esomeprazole (NEXIUM) 20 MG capsule Take 20 mg by mouth daily.     . Flaxseed, Linseed, (FLAXSEED OIL) 1000 MG CAPS Take 1 capsule by mouth daily.    . furosemide (LASIX) 40 MG tablet Take 1 tablet (40 mg total) by mouth daily as needed (swelling). 90 tablet 1  . Magnesium 200 MG TABS Take 200 mg by mouth daily.     . Multiple Vitamin (MULTIVITAMIN) tablet Take 1 tablet by mouth daily.      . nadolol (CORGARD) 40 MG tablet TAKE 1 TABLET IN THE MORNING AND 1/2 TABLET IN THE EVENING. 135 tablet 1  . potassium chloride SA (KLOR-CON) 20 MEQ tablet TAKE 1 TABLET ONCE DAILY. 90 tablet 3  . Propylene Glycol (SYSTANE COMPLETE) 0.6 % SOLN Place 1 drop into both eyes in the morning and at bedtime.    . pyridOXINE (VITAMIN B-6) 100 MG tablet Take 100 mg by mouth daily.    [name] zinc gluconate 50 MG tablet Take 50 mg by mouth daily.    [name] ezetimibe (ZETIA) 10 MG tablet Take 1 tablet (10 mg total) by mouth daily. 90 tablet 1   No current facility-administered medications on file prior to visit.    LABS/IMAGING: No results found for this or any previous visit (from the past 48 hour(s)). No results found.  LIPID PANEL:    Component Value Date/Time   CHOL 247 (H) 03/01/2020 0934   TRIG 258 (H) 03/01/2020 0934   HDL 51  03/01/2020 0934   CHOLHDL 4.8 03/01/2020 0934   VLDL 66 (H) 06/16/2019 0654   LDLCALC 154 (H) 03/01/2020 0934    WEIGHTS: Wt Readings from Last 3 Encounters:  06/26/20 151 lb (68.5 kg)  01/18/20 157 lb (71.2 kg)  07/10/19 154 lb 6.4 oz (70 kg)    VITALS: BP 123/69   Ht 4\' 11"  (1.499 m)   Wt 151 lb (68.5 kg)   BMI 30.50 kg/m   EXAM: Exam not performed due to telephone visit  EKG: Deferred  ASSESSMENT: 1. Mixed dyslipidemia,  goal LDL less than 70 2. Possible history of prior TIA 3. Mild bilateral carotid disease 4. 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 Statin myalgias  PLAN: 1.   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  Her cholesterol is now uncontrolled and remains greater than 50% over her target.  She has been statin intolerant causing myalgias and had stopped ezetimibe due to possible side effects.  We discussed a PCSK9 inhibitor but she seemed uninterested in injections and wanted to try to resume the ezetimibe and make dietary changes.  I do think it is unlikely for her to reach target with dietary changes alone and even with the ezetimibe but she is insistent on trying that.  I would recommend that her primary cardiologist repeat her lipids in a few months and if she is not at target and then interested in the PCSK9 inhibitor, I am happy to assist in the prior authorization for that.  Thanks for the kind referral.  TIME SPENT WITH PATIENT: 30 minutes of direct patient care. More than 50% of that time was spent on coordination of care and counseling regarding lipid management, statin intolerance, review of history, discussion of therapy options.   Pixie Casino, MD, [facility], [place] Director of the Advanced Lipid Disorders &  Cardiovascular Risk Reduction Clinic Diplomate of the American Board of Clinical Lipidology Attending Cardiologist  Direct Dial: [phone]  Fax: [phone]  Website:   [email or web address] Daouda Lonzo 06/26/2020, 2:06 PM

## 2020-07-16 DIAGNOSIS — I10 Essential (primary) hypertension: Secondary | ICD-10-CM | POA: Diagnosis not present

## 2020-07-25 DIAGNOSIS — M5137 Other intervertebral disc degeneration, lumbosacral region: Secondary | ICD-10-CM | POA: Diagnosis not present

## 2020-07-25 DIAGNOSIS — M533 Sacrococcygeal disorders, not elsewhere classified: Secondary | ICD-10-CM | POA: Diagnosis not present

## 2020-07-25 DIAGNOSIS — M7062 Trochanteric bursitis, left hip: Secondary | ICD-10-CM | POA: Diagnosis not present

## 2020-08-05 ENCOUNTER — Ambulatory Visit: Payer: Medicare Other | Admitting: Cardiology

## 2020-08-05 NOTE — Progress Notes (Deleted)
Clinical Summary Lindsay Petty is a 80 y.o.female seen today for follow up of the following medical problems.   1. History of chest pain - history of prior chest pain symptoms. Cath in 2003 without significant disease, she had small distal vessel disease - bleeding with ASA in the past       -- denies any recent chest pains.      2. Palpitations  - 04/2016 event monitor showed symptoms correlated with SR and PACs. We increased nadolol to 40mg  in am and 20mg  in pm     - rare palpitations, overall doing well on nadolol     3. HTN -previously had increased on higher norvasc dose, improved on lower dose however back on norvasc 10 without recurrent issues.       - compliant with meds. Has had some issues with low K on chlorthalidone, some uptrend in Cr. It was stopped previously.  -  cough previously on lisinopril in the past     4. Hyperlipidemia - statin intolerance, did not tolerate zetia - reluctant to start pcsk9 inhibitor, wanted to retry zetia **NEEDS repeat LIPIDS   - TC 281 TG 330 HDL 47 LDL 168 - followed in lipid clinic      5. LE edema - echo 02/2015 LVEF 65-03%, grade I diastolic dysfunction - controlled with just prn lasix.      SH: . Keeps her grandkids 45 and 42 years old. Has one great grandchild 8 months   Past Medical History:  Diagnosis Date   Atropine adverse reaction    atropine and sensitivity by history   Chest pain    cardiac catheterization 2003, no major epicardial disease, small distal vessels I   Ejection fraction    65%.Marland Kitchenecho April 2003   GERD (gastroesophageal reflux disease)    Hx of cholecystectomy 03/2006   lap    Hyperlipemia    Hypertension    renal artery ultrasound, January, 2011, no significant renal artery stenosis,  ... medications make her feel tired   Jaw pain    etiology uncertain   Palpitations    Statin intolerance    muscle weakness   Vertigo    Mild positional vertigo     Allergies  Allergen Reactions    Bee Venom Anaphylaxis   Amoxicillin Hives   Atropine     Heart race    Doxycycline Swelling   Band-Aid Plus Antibiotic [Bacitracin-Polymyxin B] Rash   Latex Rash     Current Outpatient Medications  Medication Sig Dispense Refill   ALPRAZolam (XANAX) 0.5 MG tablet Take 0.5 mg by mouth 2 (two) times daily as needed for anxiety.      amLODipine (NORVASC) 5 MG tablet Take 1 tablet (5 mg total) by mouth 2 (two) times daily. 180 tablet 1   Ascorbic Acid (VITAMIN C) 1000 MG tablet Take 1,000 mg by mouth daily.     aspirin EC 81 MG tablet Take 1 tablet (81 mg total) by mouth daily.     calcium carbonate (TUMS - DOSED IN MG ELEMENTAL CALCIUM) 500 MG chewable tablet Chew 1 tablet by mouth daily as needed.      Cholecalciferol (VITAMIN D3) 1000 UNITS CAPS Take 1 capsule by mouth daily.     Coenzyme Q10 (COQ10) 100 MG CAPS Take 1 capsule by mouth daily.       esomeprazole (NEXIUM) 20 MG capsule Take 20 mg by mouth daily.      ezetimibe (ZETIA) 10 MG tablet Take  1 tablet (10 mg total) by mouth daily. 90 tablet 1   Flaxseed, Linseed, (FLAXSEED OIL) 1000 MG CAPS Take 1 capsule by mouth daily.     furosemide (LASIX) 40 MG tablet Take 1 tablet (40 mg total) by mouth daily as needed (swelling). 90 tablet 1   Magnesium 200 MG TABS Take 200 mg by mouth daily.      Multiple Vitamin (MULTIVITAMIN) tablet Take 1 tablet by mouth daily.       nadolol (CORGARD) 40 MG tablet TAKE 1 TABLET IN THE MORNING AND 1/2 TABLET IN THE EVENING. 135 tablet 1   potassium chloride SA (KLOR-CON) 20 MEQ tablet TAKE 1 TABLET ONCE DAILY. 90 tablet 3   Propylene Glycol (SYSTANE COMPLETE) 0.6 % SOLN Place 1 drop into both eyes in the morning and at bedtime.     pyridOXINE (VITAMIN B-6) 100 MG tablet Take 100 mg by mouth daily.     zinc gluconate 50 MG tablet Take 50 mg by mouth daily.     No current facility-administered medications for this visit.     Past Surgical History:  Procedure Laterality Date   BREAST BIOPSY      x2 for benign disease   CARDIAC CATHETERIZATION     CHOLECYSTECTOMY     (lap) 2008.   COLONOSCOPY N/A 11/22/2013   Procedure: COLONOSCOPY;  Surgeon: Rogene Houston, MD;  Location: [facility];  Service: Endoscopy;  Laterality: N/A;  200   rt knee arthroscopy       Allergies  Allergen Reactions   Bee Venom Anaphylaxis   Amoxicillin Hives   Atropine     Heart race    Doxycycline Swelling   Band-Aid Plus Antibiotic [Bacitracin-Polymyxin B] Rash   Latex Rash      Family History  Problem Relation Age of Onset   Coronary artery disease Other        unknown     Social History Lindsay Petty reports that she has never smoked. She has never used smokeless tobacco. Lindsay Petty reports no history of alcohol use.   Review of Systems CONSTITUTIONAL: No weight loss, fever, chills, weakness or fatigue.  HEENT: Eyes: No visual loss, blurred vision, double vision or yellow sclerae.No hearing loss, sneezing, congestion, runny nose or sore throat.  SKIN: No rash or itching.  CARDIOVASCULAR:  RESPIRATORY: No shortness of breath, cough or sputum.  GASTROINTESTINAL: No anorexia, nausea, vomiting or diarrhea. No abdominal pain or blood.  GENITOURINARY: No burning on urination, no polyuria NEUROLOGICAL: No headache, dizziness, syncope, paralysis, ataxia, numbness or tingling in the extremities. No change in bowel or bladder control.  MUSCULOSKELETAL: No muscle, back pain, joint pain or stiffness.  LYMPHATICS: No enlarged nodes. No history of splenectomy.  PSYCHIATRIC: No history of depression or anxiety.  ENDOCRINOLOGIC: No reports of sweating, cold or heat intolerance. No polyuria or polydipsia.  [name]   Physical Examination There were no vitals filed for this visit. There were no vitals filed for this visit.  Gen: resting comfortably, no acute distress HEENT: no scleral icterus, pupils equal round and reactive, no palptable cervical adenopathy,  CV Resp: Clear to auscultation  bilaterally GI: abdomen is soft, non-tender, non-distended, normal bowel sounds, no hepatosplenomegaly MSK: extremities are warm, no edema.  Skin: warm, no rash Neuro:  no focal deficits Psych: appropriate affect   Diagnostic Studies 05/2001 cath ANGIOGRAPHIC DATA: 1. Ventriculography was performed in the RAO projection.  Overall left    ventricular function was preserved and no segmental abnormalities,  contraction were identified.  Ejection fraction was calculated at 61%.  I    could not appreciate significant mitral regurgitation.   2. The left main coronary artery appeared free of critical disease.   3. The left anterior descending artery coursed to the apex.  There were four    small diagonal branches.  The LAD as well as the other terminal branches of    the coronary artery all tapered rather quickly and were fairly small in    caliber.   4. There is a small ramus that was free of critical disease.   5. There was a circumflex that provided two marginals that appeared to be free    of significant disease.  Again, the vessels terminated distally as small    vessels.   6. The right coronary artery was a dominant vessel.  There was a small acute    marginal Kailynn Satterly in terms of caliber.  There was a moderate sized    posterolateral Karell Tukes.  All of these were without obvious focal narrowing.   CONCLUSIONS: 1. Preserved left ventricular function. 2. 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   Feb 25 2015 clinic EKG (performed and reviewed): NSR   Jan 2017 echo Study Conclusions  - Left ventricle: The cavity size was normal. Wall thickness was at   the upper limits of normal. Systolic function was vigorous. The   estimated ejection fraction was in the range of 60% to 65%. Wall   motion was normal; there were no regional wall motion   abnormalities. Doppler parameters are consistent with abnormal   left  ventricular relaxation (grade 1 diastolic dysfunction).   Normal filling pressures. - Mitral valve: There was trivial regurgitation. - Right atrium: Central venous pressure (est): 3 mm Hg. - Tricuspid valve: There was trivial regurgitation. - Pulmonary arteries: PA peak pressure: 27 mm Hg (S). - Pericardium, extracardiac: There was no pericardial effusion.  Impressions:  - Upper normal LV wall thickness with LVEF 60-65%. Grade 1   diastolic dysfunction with normal filling pressures. Trivial   mitral and tricuspid regurgitation. Normal PASP 27 mmHg.    04/2016 Event monitor Telemetry tracings show sinus rhythm with occasional PACs Reported symptoms correlate to NSR with PACs. No significant arrhythmias      Assessment and Plan  1. Chest pain -  previous cath with minimal disease. - denies recent symptoms, continue current meds   2. Palpitations - overall controlled, continue nadolol   3. HTN -home bps 130s to 150s, working on weight loss and dietary changes. Recent stress with passing of her brother. - monitor at this time, if needs additioanla gent would try losartan.        4. LE edema - controlled, continue prn lasix.       Arnoldo Lenis, M.D.

## 2020-08-06 ENCOUNTER — Encounter: Payer: Self-pay | Admitting: Cardiology

## 2020-08-06 ENCOUNTER — Ambulatory Visit (INDEPENDENT_AMBULATORY_CARE_PROVIDER_SITE_OTHER): Payer: Medicare Other | Admitting: Cardiology

## 2020-08-06 ENCOUNTER — Other Ambulatory Visit: Payer: Self-pay

## 2020-08-06 VITALS — BP 163/73 | HR 65 | Ht 60.0 in | Wt 155.0 lb

## 2020-08-06 DIAGNOSIS — I10 Essential (primary) hypertension: Secondary | ICD-10-CM

## 2020-08-06 DIAGNOSIS — I6523 Occlusion and stenosis of bilateral carotid arteries: Secondary | ICD-10-CM

## 2020-08-06 DIAGNOSIS — R002 Palpitations: Secondary | ICD-10-CM

## 2020-08-06 DIAGNOSIS — Z79899 Other long term (current) drug therapy: Secondary | ICD-10-CM | POA: Diagnosis not present

## 2020-08-06 DIAGNOSIS — R600 Localized edema: Secondary | ICD-10-CM | POA: Diagnosis not present

## 2020-08-06 DIAGNOSIS — E782 Mixed hyperlipidemia: Secondary | ICD-10-CM

## 2020-08-06 DIAGNOSIS — R0789 Other chest pain: Secondary | ICD-10-CM

## 2020-08-06 MED ORDER — NADOLOL 40 MG PO TABS: 40.0000 mg | ORAL_TABLET | Freq: Two times a day (BID) | ORAL | 2 refills | Status: DC

## 2020-08-06 NOTE — Progress Notes (Signed)
Clinical Summary Ms. Schlie is a 80 y.o.femaleseen today for follow up of the following medical problems.   1. History of chest pain - history of prior chest pain symptoms. Cath in 2003 without significant disease, she had small distal vessel disease - bleeding with ASA in the past    - no recent chest pains.      2. Palpitations  - 04/2016 event monitor showed symptoms correlated with SR and PACs. We increased nadolol to 40mg  in am and 20mg  in pm     - palpitations at times, fluctuates. Typically at night      3. HTN -previously had increased edema on higher norvasc dose, improved on lower dose however back on norvasc 10 without recurrent issues.       - compliant with meds. Has had some issues with low K on chlorthalidone, some uptrend in Cr. It was stopped previously.  -  cough previously on lisinopril in the past  - home bps 120s-130s/60-70s     4. Hyperlipidemia - statin intolerance, did not tolerate zetia - reluctant to start pcsk9 inhibitor  - TC 281 TG 330 HDL 47 LDL 168 - followed in lipid clinic - has been reluctant for pcsk9 I         5. LE edema - echo 02/2015 LVEF 83-66%, grade I diastolic dysfunction - controlled with just prn lasix.    - no recent edema     SH: [name] Keeps her grandkids 79 and 64 years old. Has one great grandchild 8 months   Past Medical History:  Diagnosis Date   Atropine adverse reaction    atropine and sensitivity by history   Chest pain    cardiac catheterization 2003, no major epicardial disease, small distal vessels I   Ejection fraction    65%.Marland Kitchenecho April 2003   GERD (gastroesophageal reflux disease)    Hx of cholecystectomy 03/2006   lap    Hyperlipemia    Hypertension    renal artery ultrasound, January, 2011, no significant renal artery stenosis,  ... medications make her feel tired   Jaw pain    etiology uncertain   Palpitations    Statin intolerance    muscle weakness   Vertigo    Mild positional  vertigo     Allergies  Allergen Reactions   Bee Venom Anaphylaxis   Amoxicillin Hives   Atropine     Heart race    Doxycycline Swelling   Band-Aid Plus Antibiotic [Bacitracin-Polymyxin B] Rash   Latex Rash     Current Outpatient Medications  Medication Sig Dispense Refill   ALPRAZolam (XANAX) 0.5 MG tablet Take 0.5 mg by mouth 2 (two) times daily as needed for anxiety.      amLODipine (NORVASC) 5 MG tablet Take 1 tablet (5 mg total) by mouth 2 (two) times daily. 180 tablet 1   Ascorbic Acid (VITAMIN C) 1000 MG tablet Take 1,000 mg by mouth daily.     aspirin EC 81 MG tablet Take 1 tablet (81 mg total) by mouth daily.     calcium carbonate (TUMS - DOSED IN MG ELEMENTAL CALCIUM) 500 MG chewable tablet Chew 1 tablet by mouth daily as needed.      Cholecalciferol (VITAMIN D3) 1000 UNITS CAPS Take 1 capsule by mouth daily.     Coenzyme Q10 (COQ10) 100 MG CAPS Take 1 capsule by mouth daily.       esomeprazole (NEXIUM) 20 MG capsule Take 20 mg by mouth daily.  ezetimibe (ZETIA) 10 MG tablet Take 1 tablet (10 mg total) by mouth daily. 90 tablet 1   Flaxseed, Linseed, (FLAXSEED OIL) 1000 MG CAPS Take 1 capsule by mouth daily.     furosemide (LASIX) 40 MG tablet Take 1 tablet (40 mg total) by mouth daily as needed (swelling). 90 tablet 1   Magnesium 200 MG TABS Take 200 mg by mouth daily.      Multiple Vitamin (MULTIVITAMIN) tablet Take 1 tablet by mouth daily.       nadolol (CORGARD) 40 MG tablet TAKE 1 TABLET IN THE MORNING AND 1/2 TABLET IN THE EVENING. 135 tablet 1   potassium chloride SA (KLOR-CON) 20 MEQ tablet TAKE 1 TABLET ONCE DAILY. 90 tablet 3   Propylene Glycol (SYSTANE COMPLETE) 0.6 % SOLN Place 1 drop into both eyes in the morning and at bedtime.     pyridOXINE (VITAMIN B-6) 100 MG tablet Take 100 mg by mouth daily.     zinc gluconate 50 MG tablet Take 50 mg by mouth daily.     No current facility-administered medications for this visit.     Past Surgical History:   Procedure Laterality Date   BREAST BIOPSY     x2 for benign disease   CARDIAC CATHETERIZATION     CHOLECYSTECTOMY     (lap) 2008.   COLONOSCOPY N/A 11/22/2013   Procedure: COLONOSCOPY;  Surgeon: Rogene Houston, MD;  Location: [facility];  Service: Endoscopy;  Laterality: N/A;  200   rt knee arthroscopy       Allergies  Allergen Reactions   Bee Venom Anaphylaxis   Amoxicillin Hives   Atropine     Heart race    Doxycycline Swelling   Band-Aid Plus Antibiotic [Bacitracin-Polymyxin B] Rash   Latex Rash      Family History  Problem Relation Age of Onset   Coronary artery disease Other        unknown     Social History Ms. Railey reports that she has never smoked. She has never used smokeless tobacco. Ms. Lofaso reports no history of alcohol use.   Review of Systems CONSTITUTIONAL: No weight loss, fever, chills, weakness or fatigue.  HEENT: Eyes: No visual loss, blurred vision, double vision or yellow sclerae.No hearing loss, sneezing, congestion, runny nose or sore throat.  SKIN: No rash or itching.  CARDIOVASCULAR: per hpi  RESPIRATORY: No shortness of breath, cough or sputum.  GASTROINTESTINAL: No anorexia, nausea, vomiting or diarrhea. No abdominal pain or blood.  GENITOURINARY: No burning on urination, no polyuria NEUROLOGICAL: No headache, dizziness, syncope, paralysis, ataxia, numbness or tingling in the extremities. No change in bowel or bladder control.  MUSCULOSKELETAL: No muscle, back pain, joint pain or stiffness.  LYMPHATICS: No enlarged nodes. No history of splenectomy.  PSYCHIATRIC: No history of depression or anxiety.  ENDOCRINOLOGIC: No reports of sweating, cold or heat intolerance. No polyuria or polydipsia.  [name]   Physical Examination Today's Vitals   08/06/20 1347  BP: (!) 163/73  Pulse: 65  SpO2: 97%  Weight: 155 lb (70.3 kg)  Height: 5' (1.524 m)   Body mass index is 30.27 kg/m.  Gen: resting comfortably, no acute  distress HEENT: no scleral icterus, pupils equal round and reactive, no palptable cervical adenopathy,  CV: RRR, no m/r/g, no jvd Resp: Clear to auscultation bilaterally GI: abdomen is soft, non-tender, non-distended, normal bowel sounds, no hepatosplenomegaly MSK: extremities are warm, no edema.  Skin: warm, no rash Neuro:  no focal deficits Psych:  appropriate affect   Diagnostic Studies 05/2001 cath ANGIOGRAPHIC DATA: 1. Ventriculography was performed in the RAO projection.  Overall left    ventricular function was preserved and no segmental abnormalities,    contraction were identified.  Ejection fraction was calculated at 61%.  I    could not appreciate significant mitral regurgitation.   2. The left main coronary artery appeared free of critical disease.   3. The left anterior descending artery coursed to the apex.  There were four    small diagonal branches.  The LAD as well as the other terminal branches of    the coronary artery all tapered rather quickly and were fairly small in    caliber.   4. There is a small ramus that was free of critical disease.   5. There was a circumflex that provided two marginals that appeared to be free    of significant disease.  Again, the vessels terminated distally as small    vessels.   6. The right coronary artery was a dominant vessel.  There was a small acute    marginal Ellisa Devivo in terms of caliber.  There was a moderate sized    posterolateral Ladarrian Asencio.  All of these were without obvious focal narrowing.   CONCLUSIONS: 1. Preserved left ventricular function. 2. 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   Feb 25 2015 clinic EKG (performed and reviewed): NSR   Jan 2017 echo Study Conclusions  - Left ventricle: The cavity size was normal. Wall thickness was at   the upper limits of normal. Systolic function was vigorous. The   estimated ejection fraction was in  the range of 60% to 65%. Wall   motion was normal; there were no regional wall motion   abnormalities. Doppler parameters are consistent with abnormal   left ventricular relaxation (grade 1 diastolic dysfunction).   Normal filling pressures. - Mitral valve: There was trivial regurgitation. - Right atrium: Central venous pressure (est): 3 mm Hg. - Tricuspid valve: There was trivial regurgitation. - Pulmonary arteries: PA peak pressure: 27 mm Hg (S). - Pericardium, extracardiac: There was no pericardial effusion.  Impressions:  - Upper normal LV wall thickness with LVEF 60-65%. Grade 1   diastolic dysfunction with normal filling pressures. Trivial   mitral and tricuspid regurgitation. Normal PASP 27 mmHg.    04/2016 Event monitor Telemetry tracings show sinus rhythm with occasional PACs Reported symptoms correlate to NSR with PACs. No significant arrhythmias    Assessment and Plan  1. Chest pain -  previous cath with minimal disease. - no recent symptoms, continue to monitor  2. Palpitations - some recent symptoms, increase nadolol to 40mg  bid   3. HTN -elevated in clinic, home numbers at goal - continue current meds     4. LE edema - doing well, continue prn lasix.   5. Hyperlipidemia - intolerant to statins and zetia, remains reluctant for repatha or praluent, she will call us if she changes her mind - repeat lipid panel, she has made aggressive dietary changes.      Arnoldo Lenis, M.D.

## 2020-08-06 NOTE — Patient Instructions (Addendum)
Medication Instructions:  Your physician has recommended you make the following change in your medication:  Increase nadolol to 40 mg by mouth twice daily Continue other medications the same  Labwork: Your physician recommends that you return for a FASTING lipid profile. Please do not eat or drink for at least 8 hours when you have this done. You may take your medications that morning with a sip of water. Can be one at [facility]  Testing/Procedures: none  Follow-Up: Your physician recommends that you schedule a follow-up appointment in: 6 months at the [facility].  Any Other Special Instructions Will Be Listed Below (If Applicable).  If you need a refill on your cardiac medications before your next appointment, please call your pharmacy.

## 2020-08-07 DIAGNOSIS — E782 Mixed hyperlipidemia: Secondary | ICD-10-CM | POA: Diagnosis not present

## 2020-08-09 ENCOUNTER — Telehealth: Payer: Self-pay

## 2020-08-09 NOTE — Telephone Encounter (Signed)
Spoke to patient who stated that she would give starting a cholesterol medication some thought and contact our office. Pt voiced understanding of result and had no questions or concerns at this time.

## 2020-08-09 NOTE — Telephone Encounter (Signed)
-----   Message from Arnoldo Lenis, MD sent at 08/09/2020  1:17 PM EDT ----- LDL has improved some but still above goal, would still consider the new cholesterol medications we had discussed at last visit if she decides to try   Zandra Abts MD ----- Message ----- From: Howie Ill Sent: 08/08/2020  11:27 AM EDT To: Arnoldo Lenis, MD

## 2020-08-15 DIAGNOSIS — I10 Essential (primary) hypertension: Secondary | ICD-10-CM | POA: Diagnosis not present

## 2020-08-20 DIAGNOSIS — H1045 Other chronic allergic conjunctivitis: Secondary | ICD-10-CM | POA: Diagnosis not present

## 2020-08-20 DIAGNOSIS — H524 Presbyopia: Secondary | ICD-10-CM | POA: Diagnosis not present

## 2020-08-20 DIAGNOSIS — H52223 Regular astigmatism, bilateral: Secondary | ICD-10-CM | POA: Diagnosis not present

## 2020-08-20 DIAGNOSIS — H25813 Combined forms of age-related cataract, bilateral: Secondary | ICD-10-CM | POA: Diagnosis not present

## 2020-08-20 DIAGNOSIS — H5203 Hypermetropia, bilateral: Secondary | ICD-10-CM | POA: Diagnosis not present

## 2020-09-02 ENCOUNTER — Encounter (HOSPITAL_COMMUNITY): Payer: Self-pay | Admitting: *Deleted

## 2020-09-02 ENCOUNTER — Other Ambulatory Visit: Payer: Self-pay

## 2020-09-02 ENCOUNTER — Emergency Department (HOSPITAL_COMMUNITY)
Admission: EM | Admit: 2020-09-02 | Discharge: 2020-09-03 | Disposition: A | Discharge status: Home / Self Care | Payer: Medicare Other | Attending: Emergency Medicine | Admitting: Emergency Medicine

## 2020-09-02 DIAGNOSIS — I499 Cardiac arrhythmia, unspecified: Secondary | ICD-10-CM | POA: Diagnosis not present

## 2020-09-02 DIAGNOSIS — U071 COVID-19: Secondary | ICD-10-CM | POA: Insufficient documentation

## 2020-09-02 DIAGNOSIS — R1111 Vomiting without nausea: Secondary | ICD-10-CM | POA: Diagnosis not present

## 2020-09-02 DIAGNOSIS — I10 Essential (primary) hypertension: Secondary | ICD-10-CM | POA: Insufficient documentation

## 2020-09-02 DIAGNOSIS — R0902 Hypoxemia: Secondary | ICD-10-CM | POA: Diagnosis not present

## 2020-09-02 DIAGNOSIS — Z79899 Other long term (current) drug therapy: Secondary | ICD-10-CM | POA: Diagnosis not present

## 2020-09-02 DIAGNOSIS — Z28310 Unvaccinated for covid-19: Secondary | ICD-10-CM | POA: Insufficient documentation

## 2020-09-02 DIAGNOSIS — R112 Nausea with vomiting, unspecified: Secondary | ICD-10-CM | POA: Diagnosis not present

## 2020-09-02 DIAGNOSIS — Z91040 Latex allergy status: Secondary | ICD-10-CM | POA: Diagnosis not present

## 2020-09-02 DIAGNOSIS — Z7982 Long term (current) use of aspirin: Secondary | ICD-10-CM | POA: Insufficient documentation

## 2020-09-02 DIAGNOSIS — R110 Nausea: Secondary | ICD-10-CM | POA: Diagnosis not present

## 2020-09-02 LAB — BASIC METABOLIC PANEL
Anion gap: 8 (ref 5–15)
BUN: 10 mg/dL (ref 8–23)
CO2: 27 mmol/L (ref 22–32)
Calcium: 8.4 mg/dL — ABNORMAL LOW (ref 8.9–10.3)
Chloride: 101 mmol/L (ref 98–111)
Creatinine, Ser: 0.81 mg/dL (ref 0.44–1.00)
GFR, Estimated: >60 mL/min (ref >60)
Glucose, Bld: 103 mg/dL — ABNORMAL HIGH (ref 70–99)
Potassium: 3.8 mmol/L (ref 3.5–5.1)
Sodium: 136 mmol/L (ref 135–145)

## 2020-09-02 LAB — CBC WITH DIFFERENTIAL/PLATELET
Abs Immature Granulocytes: 0.02 10*3/uL (ref 0.00–0.07)
Basophils Absolute: 0 10*3/uL (ref 0.0–0.1)
Basophils Relative: 0 %
Eosinophils Absolute: 0 10*3/uL (ref 0.0–0.5)
Eosinophils Relative: 0 %
HCT: 37.4 % (ref 36.0–46.0)
Hemoglobin: 12.5 g/dL (ref 12.0–15.0)
Immature Granulocytes: 1 %
Lymphocytes Relative: 29 %
Lymphs Abs: 1.1 10*3/uL (ref 0.7–4.0)
MCH: 31.4 pg (ref 26.0–34.0)
MCHC: 33.4 g/dL (ref 30.0–36.0)
MCV: 94 fL (ref 80.0–100.0)
Monocytes Absolute: 0.4 10*3/uL (ref 0.1–1.0)
Monocytes Relative: 10 %
Neutro Abs: 2.2 10*3/uL (ref 1.7–7.7)
Neutrophils Relative %: 60 %
Platelets: 177 10*3/uL (ref 150–400)
RBC: 3.98 MIL/uL (ref 3.87–5.11)
RDW: 14.3 % (ref 11.5–15.5)
WBC: 3.7 10*3/uL — ABNORMAL LOW (ref 4.0–10.5)
nRBC: 0 % (ref 0.0–0.2)

## 2020-09-02 MED ORDER — SODIUM CHLORIDE 0.9 % IV BOLUS
500.0000 mL | Freq: Once | INTRAVENOUS | Status: AC
  Administered 2020-09-02: 500 mL via INTRAVENOUS

## 2020-09-02 MED ORDER — ONDANSETRON HCL 4 MG/2ML IJ SOLN
4.0000 mg | Freq: Once | INTRAMUSCULAR | Status: AC
  Administered 2020-09-02: 4 mg via INTRAVENOUS
  Filled 2020-09-02: qty 2

## 2020-09-02 NOTE — ED Triage Notes (Signed)
Seen at Urgent Care and have a prescription for Zofran, took this morning.  Decrease in appetite. No change in nausea per pt. Nausea at this time.

## 2020-09-02 NOTE — ED Triage Notes (Signed)
Pt brought in by [facility] for c/o nausea x 6 days; pt had covid x 10 days ago;

## 2020-09-02 NOTE — ED Provider Notes (Signed)
[facility] EMERGENCY DEPARTMENT Provider Note   CSN: [id number] Arrival date & time: 09/02/20  6073     History Chief Complaint  Patient presents with   Nausea    Lindsay Petty is a 80 y.o. female.  HPI Patient presents for evaluation of nausea.  She was seen earlier today given a prescription for Zofran.  She is recovering from [place] which was diagnosed about 8 days ago.  She began symptoms of COVID, 08/19/2020.  Initially she had fever and chills with sore throat.  This was followed by coughing and onset of persistent nausea.  She has trouble eating because she is not hungry.  Today she tried to eat after taking Zofran but vomited so decided to come here for further evaluation.  She did not have COVID vaccines.  She denies ongoing fever, focal weakness or paresthesia.  She is not having any trouble breathing.  When she walks she has weakness of has to stop walking.  Walking does not cause shortness of breath.  There are no other known active modifying factors.    Past Medical History:  Diagnosis Date   Atropine adverse reaction    atropine and sensitivity by history   Chest pain    cardiac catheterization 2003, no major epicardial disease, small distal vessels I   Ejection fraction    65%.Marland Kitchenecho April 2003   GERD (gastroesophageal reflux disease)    Hx of cholecystectomy 03/2006   lap    Hyperlipemia    Hypertension    renal artery ultrasound, January, 2011, no significant renal artery stenosis,  ... medications make her feel tired   Jaw pain    etiology uncertain   Palpitations    Statin intolerance    muscle weakness   Vertigo    Mild positional vertigo    Patient Active Problem List   Diagnosis Date Noted   Dyslipidemia 06/16/2019   Transient neurologic deficit 06/15/2019   Personal history of colonic polyps 10/17/2013   Family hx of colon cancer 10/17/2013   Guaiac positive stools 10/17/2013   Vertigo    Chest pain    GERD (gastroesophageal reflux  disease)    Hyperlipemia    Hypertension    Ejection fraction    Statin intolerance    Palpitations    Esophageal reflux 04/30/2009   OTHER MALAISE AND FATIGUE 04/30/2009    Past Surgical History:  Procedure Laterality Date   BREAST BIOPSY     x2 for benign disease   CARDIAC CATHETERIZATION     CHOLECYSTECTOMY     (lap) 2008.   COLONOSCOPY N/A 11/22/2013   Procedure: COLONOSCOPY;  Surgeon: Rogene Houston, MD;  Location: [facility];  Service: Endoscopy;  Laterality: N/A;  200   rt knee arthroscopy       OB History   No obstetric history on file.     Family History  Problem Relation Age of Onset   Coronary artery disease Other        unknown    Social History   Tobacco Use   Smoking status: Never   Smokeless tobacco: Never  Substance Use Topics   Alcohol use: No    Alcohol/week: 0.0 standard drinks   Drug use: No    Home Medications Prior to Admission medications   Medication Sig Start Date End Date Taking? Authorizing Provider  ondansetron (ZOFRAN) 8 MG tablet Take 1 tablet (8 mg total) by mouth every 8 (eight) hours as needed for nausea or vomiting. 09/03/20  Yes Daleen Bo, MD  ALPRAZolam Duanne Moron) 0.5 MG tablet Take 0.5 mg by mouth 2 (two) times daily as needed for anxiety.     [provider]  amLODipine (NORVASC) 5 MG tablet Take 1 tablet (5 mg total) by mouth 2 (two) times daily. 04/05/20   Arnoldo Lenis, MD  Ascorbic Acid (VITAMIN C) 1000 MG tablet Take 1,000 mg by mouth daily.    [provider]  aspirin EC 81 MG tablet Take 1 tablet (81 mg total) by mouth daily. 06/16/19   Johnson, Clanford L, MD  calcium carbonate (TUMS - DOSED IN MG ELEMENTAL CALCIUM) 500 MG chewable tablet Chew 1 tablet by mouth daily as needed.     [provider]  Cholecalciferol (VITAMIN D3) 1000 UNITS CAPS Take 1 capsule by mouth daily.    [provider]  Coenzyme Q10 (COQ10) 100 MG CAPS Take 1 capsule by mouth daily.      [provider]  esomeprazole (NEXIUM) 20 MG capsule Take 20 mg by mouth daily.     [provider]  Flaxseed, Linseed, (FLAXSEED OIL) 1000 MG CAPS Take 1 capsule by mouth daily.    [provider]  furosemide (LASIX) 40 MG tablet Take 1 tablet (40 mg total) by mouth daily as needed (swelling). 04/04/19   Arnoldo Lenis, MD  Magnesium 200 MG TABS Take 200 mg by mouth daily.     [provider]  Multiple Vitamin (MULTIVITAMIN) tablet Take 1 tablet by mouth daily.      [provider]  nadolol (CORGARD) 40 MG tablet Take 1 tablet (40 mg total) by mouth 2 (two) times daily. 08/06/20   Arnoldo Lenis, MD  potassium chloride SA (KLOR-CON) 20 MEQ tablet TAKE 1 TABLET ONCE DAILY. 03/27/20   Arnoldo Lenis, MD  Propylene Glycol (SYSTANE COMPLETE) 0.6 % SOLN Place 1 drop into both eyes in the morning and at bedtime.    [provider]  pyridOXINE (VITAMIN B-6) 100 MG tablet Take 100 mg by mouth daily.    [provider]  zinc gluconate 50 MG tablet Take 50 mg by mouth daily.    [provider]    Allergies    Bee venom, Amoxicillin, Atropine, Doxycycline, Band-aid plus antibiotic [bacitracin-polymyxin b], and Latex  Review of Systems   Review of Systems  All other systems reviewed and are negative.  Physical Exam Updated Vital Signs BP (!) 150/70   Pulse 80   Temp 98.9 F (37.2 C) (Oral)   Resp 17   Ht 5' (1.524 m)   Wt 68.5 kg   SpO2 98%   BMI 29.49 kg/m   Physical Exam Vitals and nursing note reviewed.  Constitutional:      General: She is not in acute distress.    Appearance: She is well-developed. She is not ill-appearing, toxic-appearing or diaphoretic.  HENT:     Head: Normocephalic and atraumatic.     Right Ear: External ear normal.     Left Ear: External ear normal.     Mouth/Throat:     Mouth: Mucous membranes are moist.     Pharynx: No oropharyngeal exudate or posterior oropharyngeal erythema.  Eyes:      Conjunctiva/sclera: Conjunctivae normal.     Pupils: Pupils are equal, round, and reactive to light.  Neck:     Trachea: Phonation normal.  Cardiovascular:     Rate and Rhythm: Normal rate and regular rhythm.     Heart sounds:  Normal heart sounds.  Pulmonary:     Effort: Pulmonary effort is normal.     Breath sounds: Normal breath sounds.  Chest:     Chest wall: No tenderness.  Abdominal:     General: There is no distension.     Palpations: Abdomen is soft.     Tenderness: There is no abdominal tenderness. There is no guarding.  Musculoskeletal:        General: Normal range of motion.     Cervical back: Normal range of motion and neck supple.  Skin:    General: Skin is warm and dry.  Neurological:     Mental Status: She is alert and oriented to person, place, and time.     Cranial Nerves: No cranial nerve deficit.     Sensory: No sensory deficit.     Motor: No abnormal muscle tone.     Coordination: Coordination normal.  Psychiatric:        Mood and Affect: Mood normal.        Behavior: Behavior normal.        Thought Content: Thought content normal.        Judgment: Judgment normal.    ED Results / Procedures / Treatments   Labs (all labs ordered are listed, but only abnormal results are displayed) Labs Reviewed  BASIC METABOLIC PANEL - Abnormal; Notable for the following components:      Result Value   Glucose, Bld 103 (*)    Calcium 8.4 (*)    All other components within normal limits  CBC WITH DIFFERENTIAL/PLATELET - Abnormal; Notable for the following components:   WBC 3.7 (*)    All other components within normal limits    EKG None  Radiology No results found.  Procedures Procedures   Medications Ordered in ED Medications  sodium chloride 0.9 % bolus 500 mL (0 mLs Intravenous Stopped 09/02/20 2339)  ondansetron (ZOFRAN) injection 4 mg (4 mg Intravenous Given 09/02/20 2353)  sodium chloride 0.9 % bolus 500 mL (0 mLs Intravenous Stopped 09/03/20  0049)    ED Course  I have reviewed the triage vital signs and the nursing notes.  Pertinent labs & imaging results that were available during my care of the patient were reviewed by me and considered in my medical decision making (see chart for details).  Clinical Course as of 09/03/20 1326  Mon Sep 02, 2020  2354 Orthostatics negative. [EW]    Clinical Course User Index [EW] Daleen Bo, MD   MDM Rules/Calculators/A&P                           Patient Vitals for the past 24 hrs:  BP Temp Temp src Pulse Resp SpO2 Height Weight  09/03/20 0050 -- 98.9 F (37.2 C) Oral -- -- -- -- --  09/03/20 0000 (!) 150/70 -- -- 80 17 98 % -- --  09/02/20 2350 (!) 159/63 -- -- 86 -- 98 % -- --  09/02/20 2330 (!) 154/70 -- -- 79 16 99 % -- --  09/02/20 2230 (!) 154/67 -- -- 72 17 98 % -- --  09/02/20 2200 (!) 145/63 -- -- 75 19 96 % -- --  09/02/20 2130 (!) 162/65 -- -- 78 16 97 % -- --  09/02/20 2100 (!) 167/67 -- -- 77 17 98 % -- --  09/02/20 2030 (!) 174/72 -- -- 80 18 97 % -- --  09/02/20 1926 -- -- -- -- -- --  5' (1.524 m) 68.5 kg  09/02/20 1925 (!) 148/69 99.5 F (37.5 C) Oral 84 16 98 % -- --    At time of discharge- reevaluation with update and discussion. After initial assessment and treatment, an updated evaluation reveals she is tolerating oral liquids and states that she feels better.  Findings discussed and questions answered. Daleen Bo   Medical Decision Making:  This patient is presenting for evaluation of nausea and malaise following COVID infection, which does require a range of treatment options, and is a complaint that involves a moderate risk of morbidity and mortality. The differential diagnoses include active COVID infection, complications of COVID infection, incidental illness. I decided to review old records, and in summary elderly female who is not vaccinated against COVID presenting with nausea primarily.  She has a history of GERD, hypertension and transient  neurologic deficit.  I did not require additional historical information from anyone.  Clinical Laboratory Tests Ordered, included CBC, Metabolic panel, and Urinalysis. Review indicates normal except glucose high, calcium low, white count slightly low.  Urinalysis pending.   Critical Interventions-clinical evaluation, laboratory testing, IV fluids, reevaluation, additional IV fluids and Zofran.  Orthostatics ordered.  After These Interventions, the Patient was reevaluated and was found with malaise and nausea following COVID infection.  She appears to be over the primary illness, but is having some residual symptoms.  Screening evaluation is reassuring.  She has mild hypertension, but unlikely to have hypertensive urgency.  CRITICAL CARE-no Performed by: Daleen Bo  Nursing Notes Reviewed/ Care Coordinated Applicable Imaging Reviewed Interpretation of Laboratory Data incorporated into ED treatment     Final Clinical Impression(s) / ED Diagnoses Final diagnoses:  Nausea    Rx / DC Orders ED Discharge Orders          Ordered    ondansetron (ZOFRAN) 8 MG tablet  Every 8 hours PRN        09/03/20 0027             Daleen Bo, MD 09/03/20 1326

## 2020-09-03 MED ORDER — ONDANSETRON HCL 8 MG PO TABS
8.0000 mg | ORAL_TABLET | Freq: Three times a day (TID) | ORAL | 0 refills | Status: DC | PRN
Start: 2020-09-03 — End: 2021-04-21

## 2020-09-03 NOTE — Discharge Instructions (Addendum)
Start with clear liquids, then is advance diet.  Follow-up with your doctor if not better in few days, return here if needed.

## 2020-09-09 DIAGNOSIS — R110 Nausea: Secondary | ICD-10-CM | POA: Diagnosis not present

## 2020-09-09 DIAGNOSIS — Z299 Encounter for prophylactic measures, unspecified: Secondary | ICD-10-CM | POA: Diagnosis not present

## 2020-09-09 DIAGNOSIS — Z713 Dietary counseling and surveillance: Secondary | ICD-10-CM | POA: Diagnosis not present

## 2020-09-09 DIAGNOSIS — Z6831 Body mass index (BMI) 31.0-31.9, adult: Secondary | ICD-10-CM | POA: Diagnosis not present

## 2020-09-09 DIAGNOSIS — I10 Essential (primary) hypertension: Secondary | ICD-10-CM | POA: Diagnosis not present

## 2020-09-12 DIAGNOSIS — Z299 Encounter for prophylactic measures, unspecified: Secondary | ICD-10-CM | POA: Diagnosis not present

## 2020-09-12 DIAGNOSIS — R110 Nausea: Secondary | ICD-10-CM | POA: Diagnosis not present

## 2020-09-12 DIAGNOSIS — I10 Essential (primary) hypertension: Secondary | ICD-10-CM | POA: Diagnosis not present

## 2020-09-12 DIAGNOSIS — Z6831 Body mass index (BMI) 31.0-31.9, adult: Secondary | ICD-10-CM | POA: Diagnosis not present

## 2020-09-12 DIAGNOSIS — N1831 Chronic kidney disease, stage 3a: Secondary | ICD-10-CM | POA: Diagnosis not present

## 2020-10-01 DIAGNOSIS — L570 Actinic keratosis: Secondary | ICD-10-CM | POA: Diagnosis not present

## 2020-10-01 DIAGNOSIS — L718 Other rosacea: Secondary | ICD-10-CM | POA: Diagnosis not present

## 2020-10-03 DIAGNOSIS — Z7189 Other specified counseling: Secondary | ICD-10-CM | POA: Diagnosis not present

## 2020-10-03 DIAGNOSIS — R5383 Other fatigue: Secondary | ICD-10-CM | POA: Diagnosis not present

## 2020-10-03 DIAGNOSIS — Z79899 Other long term (current) drug therapy: Secondary | ICD-10-CM | POA: Diagnosis not present

## 2020-10-03 DIAGNOSIS — E7800 Pure hypercholesterolemia, unspecified: Secondary | ICD-10-CM | POA: Diagnosis not present

## 2020-10-03 DIAGNOSIS — Z789 Other specified health status: Secondary | ICD-10-CM | POA: Diagnosis not present

## 2020-10-03 DIAGNOSIS — Z299 Encounter for prophylactic measures, unspecified: Secondary | ICD-10-CM | POA: Diagnosis not present

## 2020-10-03 DIAGNOSIS — Z0000 Encounter for general adult medical examination without abnormal findings: Secondary | ICD-10-CM | POA: Diagnosis not present

## 2020-10-03 DIAGNOSIS — Z1339 Encounter for screening examination for other mental health and behavioral disorders: Secondary | ICD-10-CM | POA: Diagnosis not present

## 2020-10-03 DIAGNOSIS — Z1331 Encounter for screening for depression: Secondary | ICD-10-CM | POA: Diagnosis not present

## 2020-10-07 ENCOUNTER — Other Ambulatory Visit: Payer: Self-pay | Admitting: Cardiology

## 2020-10-29 DIAGNOSIS — E2839 Other primary ovarian failure: Secondary | ICD-10-CM | POA: Diagnosis not present

## 2020-10-29 DIAGNOSIS — Z79899 Other long term (current) drug therapy: Secondary | ICD-10-CM | POA: Diagnosis not present

## 2020-11-05 DIAGNOSIS — Z789 Other specified health status: Secondary | ICD-10-CM | POA: Diagnosis not present

## 2020-11-05 DIAGNOSIS — L538 Other specified erythematous conditions: Secondary | ICD-10-CM | POA: Diagnosis not present

## 2020-11-05 DIAGNOSIS — Z48817 Encounter for surgical aftercare following surgery on the skin and subcutaneous tissue: Secondary | ICD-10-CM | POA: Diagnosis not present

## 2020-11-05 DIAGNOSIS — L820 Inflamed seborrheic keratosis: Secondary | ICD-10-CM | POA: Diagnosis not present

## 2020-11-05 DIAGNOSIS — L298 Other pruritus: Secondary | ICD-10-CM | POA: Diagnosis not present

## 2020-11-05 DIAGNOSIS — Z872 Personal history of diseases of the skin and subcutaneous tissue: Secondary | ICD-10-CM | POA: Diagnosis not present

## 2021-01-15 ENCOUNTER — Other Ambulatory Visit: Payer: Self-pay | Admitting: Cardiology

## 2021-01-17 ENCOUNTER — Other Ambulatory Visit: Payer: Self-pay | Admitting: Nurse Practitioner

## 2021-01-17 DIAGNOSIS — Z1231 Encounter for screening mammogram for malignant neoplasm of breast: Secondary | ICD-10-CM

## 2021-01-20 ENCOUNTER — Other Ambulatory Visit: Payer: Self-pay

## 2021-01-20 ENCOUNTER — Ambulatory Visit
Admission: RE | Admit: 2021-01-20 | Discharge: 2021-01-20 | Disposition: A | Discharge status: Home / Self Care | Payer: Medicare Other | Source: Ambulatory Visit | Attending: Nurse Practitioner | Admitting: Nurse Practitioner

## 2021-01-20 DIAGNOSIS — Z1231 Encounter for screening mammogram for malignant neoplasm of breast: Secondary | ICD-10-CM

## 2021-01-30 DIAGNOSIS — R946 Abnormal results of thyroid function studies: Secondary | ICD-10-CM | POA: Diagnosis not present

## 2021-01-30 DIAGNOSIS — F419 Anxiety disorder, unspecified: Secondary | ICD-10-CM | POA: Diagnosis not present

## 2021-01-30 DIAGNOSIS — I10 Essential (primary) hypertension: Secondary | ICD-10-CM | POA: Diagnosis not present

## 2021-01-30 DIAGNOSIS — Z6833 Body mass index (BMI) 33.0-33.9, adult: Secondary | ICD-10-CM | POA: Diagnosis not present

## 2021-01-30 DIAGNOSIS — Z299 Encounter for prophylactic measures, unspecified: Secondary | ICD-10-CM | POA: Diagnosis not present

## 2021-03-31 ENCOUNTER — Other Ambulatory Visit: Payer: Self-pay | Admitting: Cardiology

## 2021-04-01 DIAGNOSIS — L570 Actinic keratosis: Secondary | ICD-10-CM | POA: Diagnosis not present

## 2021-04-21 ENCOUNTER — Encounter: Payer: Self-pay | Admitting: Cardiology

## 2021-04-21 ENCOUNTER — Ambulatory Visit (INDEPENDENT_AMBULATORY_CARE_PROVIDER_SITE_OTHER): Payer: Medicare Other | Admitting: Cardiology

## 2021-04-21 VITALS — BP 156/70 | HR 68 | Ht 60.0 in | Wt 165.0 lb

## 2021-04-21 DIAGNOSIS — R002 Palpitations: Secondary | ICD-10-CM | POA: Diagnosis not present

## 2021-04-21 DIAGNOSIS — E782 Mixed hyperlipidemia: Secondary | ICD-10-CM | POA: Diagnosis not present

## 2021-04-21 DIAGNOSIS — R0789 Other chest pain: Secondary | ICD-10-CM | POA: Diagnosis not present

## 2021-04-21 DIAGNOSIS — I10 Essential (primary) hypertension: Secondary | ICD-10-CM

## 2021-04-21 DIAGNOSIS — E612 Magnesium deficiency: Secondary | ICD-10-CM | POA: Diagnosis not present

## 2021-04-21 DIAGNOSIS — R600 Localized edema: Secondary | ICD-10-CM | POA: Diagnosis not present

## 2021-04-21 NOTE — Patient Instructions (Signed)
Medication Instructions:  ?Continue all current medications. ? ?Labwork: ?BMET, FLP, Mg - orders given today. ?Reminder:  Nothing to eat or drink after 12 midnight prior to labs. ?Office will contact with results via phone or letter.    ? ?Testing/Procedures: ?none ? ?Follow-Up: ?6 months  ? ?Any Other Special Instructions Will Be Listed Below (If Applicable). ? ? ?If you need a refill on your cardiac medications before your next appointment, please call your pharmacy. ? ?

## 2021-04-21 NOTE — Addendum Note (Signed)
Addended by: Laurine Blazer on: 04/21/2021 09:18 AM ? ? Modules accepted: Orders ? ?

## 2021-04-21 NOTE — Progress Notes (Signed)
Clinical Summary Lindsay Petty is a 81 y.o.female seen today for follow up of the following medical problems.   1. History of chest pain - history of prior chest pain symptoms. Cath in 2003 without significant disease, she had small distal vessel disease - no recent symptoms     2. Palpitations  - 04/2016 event monitor showed symptoms correlated with SR and PACs. We increased nadolol to '40mg'$  in am and '20mg'$  in pm    - symptoms have resolved, doing well on nadolol.    3. HTN -previously had increased edema on higher norvasc dose, improved on lower dose however back on norvasc 10 without recurrent issues.       - Has had some issues with low K on chlorthalidone, some uptrend in Cr. It was stopped previously.  -  cough previously on lisinopril in the past - she is compliant with meds     4. Hyperlipidemia - statin intolerance, did not tolerate zetia - reluctant to start pcsk9 inhibitor  - TC 281 TG 330 HDL 47 LDL 168 - followed in lipid clinic - has been reluctant for pcsk9 I    - 07/2020 TC 208 HDL 63 LDL 129  - remains resistant toward pcsk54mb     5. LE edema -05/2019 echo LVEF 65-70%, grade I DD   - swelling has improved off diclofenac.  - has lasix prn, does not need regularly.        SH: . Keeps her grandkids 656and 343years old. Has one great grandchild 8 months       Past Medical History:  Diagnosis Date   Atropine adverse reaction    atropine and sensitivity by history   Chest pain    cardiac catheterization 2003, no major epicardial disease, small distal vessels I   Ejection fraction    65%..Marland Kitchencho April 2003   GERD (gastroesophageal reflux disease)    Hx of cholecystectomy 03/2006   lap    Hyperlipemia    Hypertension    renal artery ultrasound, January, 2011, no significant renal artery stenosis,  ... medications make her feel tired   Jaw pain    etiology uncertain   Palpitations    Statin intolerance    muscle weakness   Vertigo    Mild  positional vertigo     Allergies  Allergen Reactions   Bee Venom Anaphylaxis   Amoxicillin Hives   Atropine     Heart race    Doxycycline Swelling   Band-Aid Plus Antibiotic [Bacitracin-Polymyxin B] Rash   Latex Rash     Current Outpatient Medications  Medication Sig Dispense Refill   ALPRAZolam (XANAX) 0.5 MG tablet Take 0.5 mg by mouth 2 (two) times daily as needed for anxiety.      amLODipine (NORVASC) 5 MG tablet TAKE (1) TABLET TWICE DAILY. 60 tablet 6   Ascorbic Acid (VITAMIN C) 1000 MG tablet Take 1,000 mg by mouth daily.     aspirin EC 81 MG tablet Take 1 tablet (81 mg total) by mouth daily.     calcium carbonate (TUMS - DOSED IN MG ELEMENTAL CALCIUM) 500 MG chewable tablet Chew 1 tablet by mouth daily as needed.      Cholecalciferol (VITAMIN D3) 1000 UNITS CAPS Take 1 capsule by mouth daily.     Coenzyme Q10 (COQ10) 100 MG CAPS Take 1 capsule by mouth daily.       esomeprazole (NEXIUM) 20 MG capsule Take 20 mg by mouth daily.  Flaxseed, Linseed, (FLAXSEED OIL) 1000 MG CAPS Take 1 capsule by mouth daily.     furosemide (LASIX) 40 MG tablet Take 1 tablet (40 mg total) by mouth daily as needed (swelling). 90 tablet 1   Magnesium 200 MG TABS Take 200 mg by mouth daily.      Multiple Vitamin (MULTIVITAMIN) tablet Take 1 tablet by mouth daily.       nadolol (CORGARD) 40 MG tablet Take 1 tablet (40 mg total) by mouth 2 (two) times daily. 180 tablet 2   ondansetron (ZOFRAN) 8 MG tablet Take 1 tablet (8 mg total) by mouth every 8 (eight) hours as needed for nausea or vomiting. 20 tablet 0   potassium chloride SA (KLOR-CON) 20 MEQ tablet TAKE 1 TABLET ONCE DAILY. 90 tablet 3   Propylene Glycol (SYSTANE COMPLETE) 0.6 % SOLN Place 1 drop into both eyes in the morning and at bedtime.     pyridOXINE (VITAMIN B-6) 100 MG tablet Take 100 mg by mouth daily.     zinc gluconate 50 MG tablet Take 50 mg by mouth daily.     No current facility-administered medications for this visit.      Past Surgical History:  Procedure Laterality Date   BREAST BIOPSY     x2 for benign disease   CARDIAC CATHETERIZATION     CHOLECYSTECTOMY     (lap) 2008.   COLONOSCOPY N/A 11/22/2013   Procedure: COLONOSCOPY;  Surgeon: Rogene Houston, MD;  Location: [facility];  Service: Endoscopy;  Laterality: N/A;  200   rt knee arthroscopy       Allergies  Allergen Reactions   Bee Venom Anaphylaxis   Amoxicillin Hives   Atropine     Heart race    Doxycycline Swelling   Band-Aid Plus Antibiotic [Bacitracin-Polymyxin B] Rash   Latex Rash      Family History  Problem Relation Age of Onset   Breast cancer Sister    Coronary artery disease Other        unknown     Social History Lindsay Petty reports that she has never smoked. She has never used smokeless tobacco. Lindsay Petty reports no history of alcohol use.   Review of Systems CONSTITUTIONAL: No weight loss, fever, chills, weakness or fatigue.  HEENT: Eyes: No visual loss, blurred vision, double vision or yellow sclerae.No hearing loss, sneezing, congestion, runny nose or sore throat.  SKIN: No rash or itching.  CARDIOVASCULAR: per hpi RESPIRATORY: No shortness of breath, cough or sputum.  GASTROINTESTINAL: No anorexia, nausea, vomiting or diarrhea. No abdominal pain or blood.  GENITOURINARY: No burning on urination, no polyuria NEUROLOGICAL: No headache, dizziness, syncope, paralysis, ataxia, numbness or tingling in the extremities. No change in bowel or bladder control.  MUSCULOSKELETAL: No muscle, back pain, joint pain or stiffness.  LYMPHATICS: No enlarged nodes. No history of splenectomy.  PSYCHIATRIC: No history of depression or anxiety.  ENDOCRINOLOGIC: No reports of sweating, cold or heat intolerance. No polyuria or polydipsia.  [name]   Physical Examination Today's Vitals   04/21/21 0834  BP: (!) 156/70  Pulse: 68  SpO2: 98%  Weight: 165 lb (74.8 kg)  Height: 5' (1.524 m)   Body mass index is 32.22  kg/m.  Gen: resting comfortably, no acute distress HEENT: no scleral icterus, pupils equal round and reactive, no palptable cervical adenopathy,  CV: RRR, no mr/g no jvd Resp: Clear to auscultation bilaterally GI: abdomen is soft, non-tender, non-distended, normal bowel sounds, no hepatosplenomegaly MSK: extremities are  warm, no edema.  Skin: warm, no rash Neuro:  no focal deficits Psych: appropriate affect   Diagnostic Studies 05/2001 cath ANGIOGRAPHIC DATA: 1. Ventriculography was performed in the RAO projection.  Overall left    ventricular function was preserved and no segmental abnormalities,    contraction were identified.  Ejection fraction was calculated at 61%.  I    could not appreciate significant mitral regurgitation.   2. The left main coronary artery appeared free of critical disease.   3. The left anterior descending artery coursed to the apex.  There were four    small diagonal branches.  The LAD as well as the other terminal branches of    the coronary artery all tapered rather quickly and were fairly small in    caliber.   4. There is a small ramus that was free of critical disease.   5. There was a circumflex that provided two marginals that appeared to be free    of significant disease.  Again, the vessels terminated distally as small    vessels.   6. The right coronary artery was a dominant vessel.  There was a small acute    marginal Audon Heymann in terms of caliber.  There was a moderate sized    posterolateral Intisar Claudio.  All of these were without obvious focal narrowing.   CONCLUSIONS: 1. Preserved left ventricular function. 2. 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   Feb 25 2015 clinic EKG (performed and reviewed): NSR   Jan 2017 echo Study Conclusions  - Left ventricle: The cavity size was normal. Wall thickness was at   the upper limits of normal. Systolic function was vigorous.  The   estimated ejection fraction was in the range of 60% to 65%. Wall   motion was normal; there were no regional wall motion   abnormalities. Doppler parameters are consistent with abnormal   left ventricular relaxation (grade 1 diastolic dysfunction).   Normal filling pressures. - Mitral valve: There was trivial regurgitation. - Right atrium: Central venous pressure (est): 3 mm Hg. - Tricuspid valve: There was trivial regurgitation. - Pulmonary arteries: PA peak pressure: 27 mm Hg (S). - Pericardium, extracardiac: There was no pericardial effusion.  Impressions:  - Upper normal LV wall thickness with LVEF 60-65%. Grade 1   diastolic dysfunction with normal filling pressures. Trivial   mitral and tricuspid regurgitation. Normal PASP 27 mmHg.    04/2016 Event monitor Telemetry tracings show sinus rhythm with occasional PACs Reported symptoms correlate to NSR with PACs. No significant arrhythmias   05/2019 echo IMPRESSIONS     1. Left ventricular ejection fraction, by estimation, is 65 to 70%. The  left ventricle has hyperdynamic function. The left ventricle has no  regional wall motion abnormalities. Left ventricular diastolic parameters  are consistent with Grade I diastolic  dysfunction (impaired relaxation).   2. Right ventricular systolic function is normal. The right ventricular  size is normal.   3. Left atrial size was mildly dilated.   4. The mitral valve is normal in structure. No evidence of mitral valve  regurgitation. No evidence of mitral stenosis.   5. The aortic valve is tricuspid. Aortic valve regurgitation is not  visualized. No aortic stenosis is present.   6. The inferior vena cava is normal in size with greater than 50%  respiratory variability, suggesting right atrial pressure of 3 mmHg.   Assessment and Plan  1. Chest pain -  previous cath with minimal disease. - no symptoms, continue to monitor   2. Palpitations -symptoms have resolved, continue  nadolol   3. HTN -elevated in clinic today, she will call home bp's later this week. Would add losartan if additional agent is needed     4. LE edema - overall controlled, continue prn lasix and compression stockings.    5. Hyperlipidemia - intolerant to statins and zetia, remains reluctant for repatha or praluent - repeat lipid panel      Arnoldo Lenis, M.D.

## 2021-04-22 LAB — BASIC METABOLIC PANEL
BUN: 13 mg/dL (ref 7–25)
CO2: 29 mmol/L (ref 20–32)
Calcium: 9.7 mg/dL (ref 8.6–10.4)
Chloride: 106 mmol/L (ref 98–110)
Creat: 0.94 mg/dL (ref 0.60–0.95)
Glucose, Bld: 106 mg/dL — ABNORMAL HIGH (ref 65–99)
Potassium: 4.5 mmol/L (ref 3.5–5.3)
Sodium: 142 mmol/L (ref 135–146)

## 2021-04-22 LAB — LIPID PANEL
Cholesterol: 241 mg/dL — ABNORMAL HIGH (ref <200)
HDL: 51 mg/dL (ref >=50)
LDL Cholesterol (Calc): 143 mg/dL (calc) — ABNORMAL HIGH
Non-HDL Cholesterol (Calc): 190 mg/dL (calc) — ABNORMAL HIGH (ref <130)
Total CHOL/HDL Ratio: 4.7 (calc) (ref <5.0)
Triglycerides: 306 mg/dL — ABNORMAL HIGH (ref <150)

## 2021-04-22 LAB — MAGNESIUM: Magnesium: 2.3 mg/dL (ref 1.5–2.5)

## 2021-04-24 ENCOUNTER — Other Ambulatory Visit: Payer: Self-pay | Admitting: Cardiology

## 2021-04-28 ENCOUNTER — Telehealth: Payer: Self-pay | Admitting: *Deleted

## 2021-04-28 NOTE — Telephone Encounter (Signed)
-----   Message from Arnoldo Lenis, MD sent at 04/27/2021 11:27 AM EDT ----- ?Cholesterol remains elecated. LDL is 143, would want to have it about 1/2 of that. Would continue to recommend trying repatha if she is willing.  ? ?Zandra Abts MD ?

## 2021-04-28 NOTE — Telephone Encounter (Signed)
Laurine Blazer, LPN  ?1/75/1025 85:27 AM EDT Back to Top  ?  ?Patient notified and verbalized understanding.  States she still is hesitant to do injections but will do more research on Repatha.  States she will work on her diet for now & call back if she changes her mind.  ? ?

## 2021-06-16 ENCOUNTER — Encounter (HOSPITAL_COMMUNITY): Payer: Self-pay | Admitting: *Deleted

## 2021-06-16 ENCOUNTER — Other Ambulatory Visit: Payer: Self-pay

## 2021-06-16 ENCOUNTER — Emergency Department (HOSPITAL_COMMUNITY): Payer: Medicare Other

## 2021-06-16 ENCOUNTER — Emergency Department (HOSPITAL_COMMUNITY)
Admission: EM | Admit: 2021-06-16 | Discharge: 2021-06-16 | Disposition: A | Discharge status: Home / Self Care | Payer: Medicare Other | Attending: Emergency Medicine | Admitting: Emergency Medicine

## 2021-06-16 DIAGNOSIS — Z91040 Latex allergy status: Secondary | ICD-10-CM | POA: Insufficient documentation

## 2021-06-16 DIAGNOSIS — Z79899 Other long term (current) drug therapy: Secondary | ICD-10-CM | POA: Insufficient documentation

## 2021-06-16 DIAGNOSIS — Z7982 Long term (current) use of aspirin: Secondary | ICD-10-CM | POA: Insufficient documentation

## 2021-06-16 DIAGNOSIS — R1032 Left lower quadrant pain: Secondary | ICD-10-CM

## 2021-06-16 DIAGNOSIS — K429 Umbilical hernia without obstruction or gangrene: Secondary | ICD-10-CM | POA: Diagnosis not present

## 2021-06-16 DIAGNOSIS — N281 Cyst of kidney, acquired: Secondary | ICD-10-CM | POA: Diagnosis not present

## 2021-06-16 DIAGNOSIS — I10 Essential (primary) hypertension: Secondary | ICD-10-CM | POA: Insufficient documentation

## 2021-06-16 DIAGNOSIS — D259 Leiomyoma of uterus, unspecified: Secondary | ICD-10-CM

## 2021-06-16 DIAGNOSIS — K760 Fatty (change of) liver, not elsewhere classified: Secondary | ICD-10-CM | POA: Diagnosis not present

## 2021-06-16 LAB — CBC
HCT: 41.5 % (ref 36.0–46.0)
Hemoglobin: 14.1 g/dL (ref 12.0–15.0)
MCH: 31.9 pg (ref 26.0–34.0)
MCHC: 34 g/dL (ref 30.0–36.0)
MCV: 93.9 fL (ref 80.0–100.0)
Platelets: 276 10*3/uL (ref 150–400)
RBC: 4.42 MIL/uL (ref 3.87–5.11)
RDW: 13.7 % (ref 11.5–15.5)
WBC: 6.2 10*3/uL (ref 4.0–10.5)
nRBC: 0 % (ref 0.0–0.2)

## 2021-06-16 LAB — COMPREHENSIVE METABOLIC PANEL
ALT: 39 U/L (ref 0–44)
AST: 38 U/L (ref 15–41)
Albumin: 4.5 g/dL (ref 3.5–5.0)
Alkaline Phosphatase: 85 U/L (ref 38–126)
Anion gap: 9 (ref 5–15)
BUN: 16 mg/dL (ref 8–23)
CO2: 28 mmol/L (ref 22–32)
Calcium: 9.6 mg/dL (ref 8.9–10.3)
Chloride: 105 mmol/L (ref 98–111)
Creatinine, Ser: 0.92 mg/dL (ref 0.44–1.00)
GFR, Estimated: >60 mL/min (ref >60)
Glucose, Bld: 114 mg/dL — ABNORMAL HIGH (ref 70–99)
Potassium: 4.3 mmol/L (ref 3.5–5.1)
Sodium: 142 mmol/L (ref 135–145)
Total Bilirubin: 0.7 mg/dL (ref 0.3–1.2)
Total Protein: 7.9 g/dL (ref 6.5–8.1)

## 2021-06-16 LAB — URINALYSIS, ROUTINE W REFLEX MICROSCOPIC
Bacteria, UA: NONE SEEN
Bilirubin Urine: NEGATIVE
Glucose, UA: NEGATIVE mg/dL
Hgb urine dipstick: NEGATIVE
Ketones, ur: NEGATIVE mg/dL
Nitrite: NEGATIVE
Protein, ur: NEGATIVE mg/dL
Specific Gravity, Urine: 1.035 — ABNORMAL HIGH (ref 1.005–1.030)
pH: 8 (ref 5.0–8.0)

## 2021-06-16 LAB — LIPASE, BLOOD: Lipase: 45 U/L (ref 11–51)

## 2021-06-16 MED ORDER — IOHEXOL 300 MG/ML  SOLN
100.0000 mL | Freq: Once | INTRAMUSCULAR | Status: AC | PRN
  Administered 2021-06-16: 100 mL via INTRAVENOUS

## 2021-06-16 NOTE — ED Provider Notes (Signed)
?[facility] ?Provider Note ? ? ?CSN: [id number] ?Arrival date & time: 06/16/21  1226 ? ?  ? ?History ? ?Chief Complaint  ?Patient presents with  ? Abdominal Pain  ? ? ?Lindsay Petty is a 81 y.o. female with a history including GERD, hyperlipidemia, hypertension and known uterine fibroid, also history of diverticulosis presenting with left lower abdominal pain along with nausea and had a transient episode of lightheadedness this morning.  She states that she frequently has mild pain in the left lower abdomen which typically does not bother her unless she palpates the site deeply, stating that the symptom has been present for a long period of time.  However she occasionally has episodes where this pain escalates and then returns to its baseline chronicity.  Since yesterday she has had a severe episode of this pain which is improved currently.  She states her pain was severe to the point where she could not stand up straight it was worsened with movement and deep palpation.  It is not accompanied by fevers, she also denies constipation or diarrhea, dysuria.  She also denies chest pain or shortness of breath.  Denies fevers.  She takes as needed Tylenol which generally helps the symptoms when it is mild. ? ? ?The history is provided by the patient.  ? ?  ? ?Home Medications ?Prior to Admission medications   ?Medication Sig Start Date End Date Taking? Authorizing Provider  ?acetaminophen (TYLENOL) 500 MG tablet Take 500 mg by mouth every 6 (six) hours as needed.   Yes [provider]  ?ALPRAZolam Duanne Moron) 0.5 MG tablet Take 0.5 mg by mouth 2 (two) times daily as needed for anxiety.    Yes [provider]  ?amLODipine (NORVASC) 5 MG tablet TAKE (1) TABLET TWICE DAILY. ?Patient taking differently: Take 5 mg by mouth 2 (two) times daily. 03/31/21  Yes BranchAlphonse Guild, MD  ?Ascorbic Acid (VITAMIN C) 1000 MG tablet Take 1,000 mg by mouth daily.   Yes [provider]   ?calcium carbonate (TUMS - DOSED IN MG ELEMENTAL CALCIUM) 500 MG chewable tablet Chew 1 tablet by mouth daily as needed.    Yes [provider]  ?Cholecalciferol (VITAMIN D3) 1000 UNITS CAPS Take 1 capsule by mouth daily.   Yes [provider]  ?Coenzyme Q10 (COQ10) 100 MG CAPS Take 1 capsule by mouth daily.     Yes [provider]  ?esomeprazole (NEXIUM) 20 MG capsule Take 20 mg by mouth daily.    Yes [provider]  ?Flaxseed, Linseed, (FLAXSEED OIL) 1000 MG CAPS Take 1 capsule by mouth daily.   Yes [provider]  ?furosemide (LASIX) 40 MG tablet Take 1 tablet (40 mg total) by mouth daily as needed (swelling). 04/04/19  Yes BranchAlphonse Guild, MD  ?Magnesium 200 MG TABS Take 200 mg by mouth daily.    Yes [provider]  ?Multiple Vitamin (MULTIVITAMIN) tablet Take 1 tablet by mouth daily.     Yes [provider]  ?nadolol (CORGARD) 40 MG tablet Take 1 tablet (40 mg total) by mouth 2 (two) times daily. 08/06/20  Yes Arnoldo Lenis, MD  ?Omega-3 Fatty Acids (OMEGA-3 2100 PO) Take by mouth.   Yes [provider]  ?potassium chloride SA (KLOR-CON M) 20 MEQ tablet TAKE 1 TABLET ONCE DAILY. 04/24/21  Yes Branch, [name], MD  ?pyridOXINE (VITAMIN B-6) 100 MG tablet Take 100 mg by mouth daily.   Yes [provider]  ?zinc  gluconate 50 MG tablet Take 50 mg by mouth daily.   Yes [provider]  ?aspirin EC 81 MG tablet Take 1 tablet (81 mg total) by mouth daily. ?Patient not taking: Reported on 04/21/2021 06/16/19   Murlean Iba, MD  ?   ? ?Allergies    ?Bee venom, Amoxicillin, Atropine, Doxycycline, Band-aid plus antibiotic [bacitracin-polymyxin b], and Latex   ? ?Review of Systems   ?Review of Systems  ?Constitutional:  Negative for chills and fever.  ?HENT:  Negative for congestion and sore throat.   ?Eyes: Negative.   ?Respiratory:  Negative for chest tightness and shortness of breath.   ?Cardiovascular:  Negative for  chest pain.  ?Gastrointestinal:  Positive for abdominal pain and nausea. Negative for constipation and diarrhea.  ?Genitourinary: Negative.   ?Musculoskeletal:  Negative for arthralgias, joint swelling and neck pain.  ?Skin: Negative.  Negative for rash and wound.  ?Neurological:  Negative for dizziness, weakness, light-headedness, numbness and headaches.  ?Psychiatric/Behavioral: Negative.    ? ?Physical Exam ?Updated Vital Signs ?BP (!) 189/76   Pulse 66   Temp 98.1 ?F (36.7 ?C) (Oral)   Resp 18   Ht '4\' 11"'$  (1.499 m)   Wt 73 kg   SpO2 97%   BMI 32.52 kg/m?  ?Physical Exam ?Vitals and nursing note reviewed.  ?Constitutional:   ?   Appearance: She is well-developed.  ?HENT:  ?   Head: Normocephalic and atraumatic.  ?Eyes:  ?   Conjunctiva/sclera: Conjunctivae normal.  ?Cardiovascular:  ?   Rate and Rhythm: Normal rate and regular rhythm.  ?   Heart sounds: Normal heart sounds.  ?Pulmonary:  ?   Effort: Pulmonary effort is normal.  ?   Breath sounds: Normal breath sounds. No wheezing.  ?Abdominal:  ?   General: Bowel sounds are normal.  ?   Palpations: Abdomen is soft.  ?   Tenderness: There is abdominal tenderness in the left lower quadrant. There is rebound. There is no guarding.  ?Musculoskeletal:     ?   General: Normal range of motion.  ?   Cervical back: Normal range of motion.  ?Skin: ?   General: Skin is warm and dry.  ?Neurological:  ?   General: No focal deficit present.  ?   Mental Status: She is alert.  ? ? ?ED Results / Procedures / Treatments   ?Labs ?(all labs ordered are listed, but only abnormal results are displayed) ?Labs Reviewed  ?COMPREHENSIVE METABOLIC PANEL - Abnormal; Notable for the following components:  ?    Result Value  ? Glucose, Bld 114 (*)   ? All other components within normal limits  ?URINALYSIS, ROUTINE W REFLEX MICROSCOPIC - Abnormal; Notable for the following components:  ? Color, Urine STRAW (*)   ? Specific Gravity, Urine 1.035 (*)   ? Leukocytes,Ua SMALL (*)   ? Non  Squamous Epithelial 0-5 (*)   ? All other components within normal limits  ?LIPASE, BLOOD  ?CBC  ? ? ?EKG ?None ? ?Radiology ?CT ABDOMEN PELVIS W CONTRAST ? ?Result Date: 06/16/2021 ?CLINICAL DATA:  LLQ abdominal pain EXAM: CT ABDOMEN AND PELVIS WITH CONTRAST TECHNIQUE: Multidetector CT imaging of the abdomen and pelvis was performed using the standard protocol following bolus administration of intravenous contrast. RADIATION DOSE REDUCTION: This exam was performed according to the departmental dose-optimization program which includes automated exposure control, adjustment of the mA and/or kV according to patient size and/or use of iterative reconstruction technique. CONTRAST:  187m OMNIPAQUE IOHEXOL  300 MG/ML  SOLN COMPARISON:  None. FINDINGS: Lower chest: No acute abnormality. Hepatobiliary: Hepatic steatosis.  Prior cholecystectomy. Pancreas: Unremarkable. No pancreatic ductal dilatation or surrounding inflammatory changes. Spleen: Normal in size without focal abnormality. Adrenals/Urinary Tract: Adrenal glands are unremarkable. No hydronephrosis or nephrolithiasis. Tiny left renal cysts. The bladder is minimally distended. Stomach/Bowel: Small hiatal hernia. The stomach is otherwise within normal limits. There is no evidence of bowel obstruction.Mildly prominent appendix measuring up to 8-9 mm without any periappendiceal stranding, gas, or fluid collection. Scattered colonic diverticula. No diverticulitis. Vascular/Lymphatic: Aortoiliac atherosclerosis. No AAA. No lymphadenopathy. Reproductive: Anterior uterine fibroid.  No adnexal mass. Other: Tiny fat containing umbilical hernia. No bowel containing hernia. No ascites. No free air. Musculoskeletal: No acute osseous abnormality. No suspicious lytic or blastic lesions. Multilevel degenerative changes of the spine with grade 1 degenerative anterolisthesis at L4-L5. 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 IMPRESSION: Mildly prominent appendix  measuring up to 8-9 mm, though without any periappendiceal inflammation or fluid collection. Correlate with exam and laboratory findings. No other acute findings. Scattered diverticula.  No evidence of diverticulitis. S

## 2021-06-16 NOTE — Discharge Instructions (Signed)
As discussed, it is not clear what is the source of your acute and chronic left lower quadrant abdominal pain.  It is possible it is related to the fibroid in your uterus and we do recommend that you are further evaluated for this by a gynecologist.  [facility] tomorrow to arrange an office visit with them.  Additionally you do have a tiny umbilical hernia which does not appear to be the source of today's pain, however you may need to have this evaluated as well by a general surgeon if the gynecologist does not feel your pain is secondary to the fibroid.  Call Dr. Arnoldo Morale or general surgeon to arrange a visit with him as well. ? ?In the interim return here if you have any new, or worsened symptoms. ?

## 2021-06-16 NOTE — ED Triage Notes (Signed)
Pt c/o left side abdominal pain with nausea and states she had a dizzy spell this am; pt states her BP has been elevated as well; pt states the pain is worse with movement and when she touches the area ?

## 2021-06-24 DIAGNOSIS — I10 Essential (primary) hypertension: Secondary | ICD-10-CM | POA: Diagnosis not present

## 2021-06-24 DIAGNOSIS — M5136 Other intervertebral disc degeneration, lumbar region: Secondary | ICD-10-CM | POA: Diagnosis not present

## 2021-06-24 DIAGNOSIS — K449 Diaphragmatic hernia without obstruction or gangrene: Secondary | ICD-10-CM | POA: Diagnosis not present

## 2021-06-24 DIAGNOSIS — Z789 Other specified health status: Secondary | ICD-10-CM | POA: Diagnosis not present

## 2021-06-24 DIAGNOSIS — D259 Leiomyoma of uterus, unspecified: Secondary | ICD-10-CM | POA: Diagnosis not present

## 2021-06-24 DIAGNOSIS — Z299 Encounter for prophylactic measures, unspecified: Secondary | ICD-10-CM | POA: Diagnosis not present

## 2021-06-26 DIAGNOSIS — M533 Sacrococcygeal disorders, not elsewhere classified: Secondary | ICD-10-CM | POA: Diagnosis not present

## 2021-06-26 DIAGNOSIS — M7062 Trochanteric bursitis, left hip: Secondary | ICD-10-CM | POA: Diagnosis not present

## 2021-06-26 DIAGNOSIS — M5137 Other intervertebral disc degeneration, lumbosacral region: Secondary | ICD-10-CM | POA: Diagnosis not present

## 2021-06-30 ENCOUNTER — Other Ambulatory Visit: Payer: Self-pay | Admitting: Cardiology

## 2021-07-15 DIAGNOSIS — R1032 Left lower quadrant pain: Secondary | ICD-10-CM | POA: Diagnosis not present

## 2021-07-15 DIAGNOSIS — D259 Leiomyoma of uterus, unspecified: Secondary | ICD-10-CM | POA: Diagnosis not present

## 2021-07-18 DIAGNOSIS — Z780 Asymptomatic menopausal state: Secondary | ICD-10-CM | POA: Diagnosis not present

## 2021-07-18 DIAGNOSIS — R1032 Left lower quadrant pain: Secondary | ICD-10-CM | POA: Diagnosis not present

## 2021-07-18 DIAGNOSIS — D252 Subserosal leiomyoma of uterus: Secondary | ICD-10-CM | POA: Diagnosis not present

## 2021-07-24 DIAGNOSIS — M170 Bilateral primary osteoarthritis of knee: Secondary | ICD-10-CM | POA: Diagnosis not present

## 2021-07-24 DIAGNOSIS — M5137 Other intervertebral disc degeneration, lumbosacral region: Secondary | ICD-10-CM | POA: Diagnosis not present

## 2021-07-24 DIAGNOSIS — M7062 Trochanteric bursitis, left hip: Secondary | ICD-10-CM | POA: Diagnosis not present

## 2021-07-24 DIAGNOSIS — M533 Sacrococcygeal disorders, not elsewhere classified: Secondary | ICD-10-CM | POA: Diagnosis not present

## 2021-09-18 DIAGNOSIS — M7062 Trochanteric bursitis, left hip: Secondary | ICD-10-CM | POA: Diagnosis not present

## 2021-09-18 DIAGNOSIS — M5137 Other intervertebral disc degeneration, lumbosacral region: Secondary | ICD-10-CM | POA: Diagnosis not present

## 2021-10-09 DIAGNOSIS — L570 Actinic keratosis: Secondary | ICD-10-CM | POA: Diagnosis not present

## 2021-10-09 DIAGNOSIS — C44311 Basal cell carcinoma of skin of nose: Secondary | ICD-10-CM | POA: Diagnosis not present

## 2021-10-09 DIAGNOSIS — Z08 Encounter for follow-up examination after completed treatment for malignant neoplasm: Secondary | ICD-10-CM | POA: Diagnosis not present

## 2021-10-09 DIAGNOSIS — L718 Other rosacea: Secondary | ICD-10-CM | POA: Diagnosis not present

## 2021-10-09 DIAGNOSIS — X32XXXA Exposure to sunlight, initial encounter: Secondary | ICD-10-CM | POA: Diagnosis not present

## 2021-10-09 DIAGNOSIS — Z85828 Personal history of other malignant neoplasm of skin: Secondary | ICD-10-CM | POA: Diagnosis not present

## 2021-10-15 DIAGNOSIS — Z299 Encounter for prophylactic measures, unspecified: Secondary | ICD-10-CM | POA: Diagnosis not present

## 2021-10-15 DIAGNOSIS — M8580 Other specified disorders of bone density and structure, unspecified site: Secondary | ICD-10-CM | POA: Diagnosis not present

## 2021-10-15 DIAGNOSIS — Z1339 Encounter for screening examination for other mental health and behavioral disorders: Secondary | ICD-10-CM | POA: Diagnosis not present

## 2021-10-15 DIAGNOSIS — Z79899 Other long term (current) drug therapy: Secondary | ICD-10-CM | POA: Diagnosis not present

## 2021-10-15 DIAGNOSIS — R5383 Other fatigue: Secondary | ICD-10-CM | POA: Diagnosis not present

## 2021-10-15 DIAGNOSIS — Z6833 Body mass index (BMI) 33.0-33.9, adult: Secondary | ICD-10-CM | POA: Diagnosis not present

## 2021-10-15 DIAGNOSIS — Z7189 Other specified counseling: Secondary | ICD-10-CM | POA: Diagnosis not present

## 2021-10-15 DIAGNOSIS — Z1331 Encounter for screening for depression: Secondary | ICD-10-CM | POA: Diagnosis not present

## 2021-10-15 DIAGNOSIS — I10 Essential (primary) hypertension: Secondary | ICD-10-CM | POA: Diagnosis not present

## 2021-10-15 DIAGNOSIS — Z789 Other specified health status: Secondary | ICD-10-CM | POA: Diagnosis not present

## 2021-10-15 DIAGNOSIS — Z0000 Encounter for general adult medical examination without abnormal findings: Secondary | ICD-10-CM | POA: Diagnosis not present

## 2021-10-15 DIAGNOSIS — E7800 Pure hypercholesterolemia, unspecified: Secondary | ICD-10-CM | POA: Diagnosis not present

## 2021-10-21 DIAGNOSIS — R7989 Other specified abnormal findings of blood chemistry: Secondary | ICD-10-CM | POA: Diagnosis not present

## 2021-10-21 DIAGNOSIS — N1831 Chronic kidney disease, stage 3a: Secondary | ICD-10-CM | POA: Diagnosis not present

## 2021-10-21 DIAGNOSIS — Z299 Encounter for prophylactic measures, unspecified: Secondary | ICD-10-CM | POA: Diagnosis not present

## 2021-10-21 DIAGNOSIS — I10 Essential (primary) hypertension: Secondary | ICD-10-CM | POA: Diagnosis not present

## 2021-10-21 DIAGNOSIS — Z789 Other specified health status: Secondary | ICD-10-CM | POA: Diagnosis not present

## 2021-10-21 DIAGNOSIS — G720 Drug-induced myopathy: Secondary | ICD-10-CM | POA: Diagnosis not present

## 2021-10-28 ENCOUNTER — Ambulatory Visit: Payer: Medicare Other | Attending: Cardiology | Admitting: Cardiology

## 2021-10-28 ENCOUNTER — Encounter: Payer: Self-pay | Admitting: Cardiology

## 2021-10-28 VITALS — BP 130/76 | HR 66 | Ht 59.0 in | Wt 165.8 lb

## 2021-10-28 DIAGNOSIS — E782 Mixed hyperlipidemia: Secondary | ICD-10-CM | POA: Diagnosis not present

## 2021-10-28 DIAGNOSIS — Z79899 Other long term (current) drug therapy: Secondary | ICD-10-CM | POA: Insufficient documentation

## 2021-10-28 DIAGNOSIS — I10 Essential (primary) hypertension: Secondary | ICD-10-CM | POA: Diagnosis not present

## 2021-10-28 DIAGNOSIS — I2510 Atherosclerotic heart disease of native coronary artery without angina pectoris: Secondary | ICD-10-CM | POA: Diagnosis not present

## 2021-10-28 NOTE — Progress Notes (Signed)
Clinical Summary Lindsay Petty is a 81 y.o.female seen today for follow up of the following medical problems.   1. History of chest pain/CAD - history of prior chest pain symptoms. Cath in 2003 without significant disease, she had small distal vessel disease - no chest pains, no SOB/DOE     2. Palpitations  - 04/2016 event monitor showed symptoms correlated with SR and PACs.Taking nadolol '40mg'$  bid   No palpitaitons.    3. HTN -previously had increased edema on higher norvasc dose, improved on lower dose however back on norvasc 10 without recurrent issues.       - Has had some issues with low K on chlorthalidone, some uptrend in Cr. It was stopped previously.  -  cough previously on lisinopril in the past - she is compliant with meds  - compliant with meds - home bp's 130s/70s     4. Hyperlipidemia - statin intolerance, did not tolerate zetia - reluctant to start pcsk9 inhibitor  - TC 281 TG 330 HDL 47 LDL 168 - followed in lipid clinic - has been reluctant for pcsk9 I    - 07/2020 TC 208 HDL 63 LDL 129  - remains resistant toward pcsk27mb     5. LE edema -05/2019 echo LVEF 65-70%, grade I DD   - swelling has improved off diclofenac.  - has lasix prn, does not need regularly.   - no recent edema, wears compression socks, Has not needed lasix.      SH: . Keeps her grandkids 148and 942years old. Has one great grandchild 8 months Past Medical History:  Diagnosis Date   Atropine adverse reaction    atropine and sensitivity by history   Chest pain    cardiac catheterization 2003, no major epicardial disease, small distal vessels I   Ejection fraction    65%..Marland Kitchencho April 2003   GERD (gastroesophageal reflux disease)    Hx of cholecystectomy 03/2006   lap    Hyperlipemia    Hypertension    renal artery ultrasound, January, 2011, no significant renal artery stenosis,  ... medications make her feel tired   Jaw pain    etiology uncertain   Palpitations     Statin intolerance    muscle weakness   Vertigo    Mild positional vertigo     Allergies  Allergen Reactions   Bee Venom Anaphylaxis   Amoxicillin Hives   Atropine     Heart race    Doxycycline Swelling   Band-Aid Plus Antibiotic [Bacitracin-Polymyxin B] Rash   Latex Rash     Current Outpatient Medications  Medication Sig Dispense Refill   acetaminophen (TYLENOL) 500 MG tablet Take 500 mg by mouth every 6 (six) hours as needed.     ALPRAZolam (XANAX) 0.5 MG tablet Take 0.5 mg by mouth 2 (two) times daily as needed for anxiety.      amLODipine (NORVASC) 5 MG tablet TAKE (1) TABLET TWICE DAILY. (Patient taking differently: Take 5 mg by mouth 2 (two) times daily.) 60 tablet 6   Ascorbic Acid (VITAMIN C) 1000 MG tablet Take 1,000 mg by mouth daily.     aspirin EC 81 MG tablet Take 1 tablet (81 mg total) by mouth daily. (Patient not taking: Reported on 04/21/2021)     calcium carbonate (TUMS - DOSED IN MG ELEMENTAL CALCIUM) 500 MG chewable tablet Chew 1 tablet by mouth daily as needed.      Cholecalciferol (VITAMIN D3) 1000 UNITS CAPS Take  1 capsule by mouth daily.     Coenzyme Q10 (COQ10) 100 MG CAPS Take 1 capsule by mouth daily.       esomeprazole (NEXIUM) 20 MG capsule Take 20 mg by mouth daily.      Flaxseed, Linseed, (FLAXSEED OIL) 1000 MG CAPS Take 1 capsule by mouth daily.     furosemide (LASIX) 40 MG tablet Take 1 tablet (40 mg total) by mouth daily as needed (swelling). 90 tablet 1   Magnesium 200 MG TABS Take 200 mg by mouth daily.      Multiple Vitamin (MULTIVITAMIN) tablet Take 1 tablet by mouth daily.       nadolol (CORGARD) 40 MG tablet TAKE (1) TABLET TWICE DAILY. 180 tablet 1   Omega-3 Fatty Acids (OMEGA-3 2100 PO) Take by mouth.     potassium chloride SA (KLOR-CON M) 20 MEQ tablet TAKE 1 TABLET ONCE DAILY. 30 tablet 6   pyridOXINE (VITAMIN B-6) 100 MG tablet Take 100 mg by mouth daily.     zinc gluconate 50 MG tablet Take 50 mg by mouth daily.     No current  facility-administered medications for this visit.     Past Surgical History:  Procedure Laterality Date   BREAST BIOPSY     x2 for benign disease   CARDIAC CATHETERIZATION     CHOLECYSTECTOMY     (lap) 2008.   COLONOSCOPY N/A 11/22/2013   Procedure: COLONOSCOPY;  Surgeon: Rogene Houston, MD;  Location: [facility];  Service: Endoscopy;  Laterality: N/A;  200   rt knee arthroscopy       Allergies  Allergen Reactions   Bee Venom Anaphylaxis   Amoxicillin Hives   Atropine     Heart race    Doxycycline Swelling   Band-Aid Plus Antibiotic [Bacitracin-Polymyxin B] Rash   Latex Rash      Family History  Problem Relation Age of Onset   Breast cancer Sister    Coronary artery disease Other        unknown     Social History Ms. Coote reports that she has never smoked. She has never used smokeless tobacco. Ms. Ebey reports no history of alcohol use.   Review of Systems CONSTITUTIONAL: No weight loss, fever, chills, weakness or fatigue.  HEENT: Eyes: No visual loss, blurred vision, double vision or yellow sclerae.No hearing loss, sneezing, congestion, runny nose or sore throat.  SKIN: No rash or itching.  CARDIOVASCULAR:  RESPIRATORY: No shortness of breath, cough or sputum.  GASTROINTESTINAL: No anorexia, nausea, vomiting or diarrhea. No abdominal pain or blood.  GENITOURINARY: No burning on urination, no polyuria NEUROLOGICAL: No headache, dizziness, syncope, paralysis, ataxia, numbness or tingling in the extremities. No change in bowel or bladder control.  MUSCULOSKELETAL: No muscle, back pain, joint pain or stiffness.  LYMPHATICS: No enlarged nodes. No history of splenectomy.  PSYCHIATRIC: No history of depression or anxiety.  ENDOCRINOLOGIC: No reports of sweating, cold or heat intolerance. No polyuria or polydipsia.  [name]   Physical Examination There were no vitals filed for this visit. There were no vitals filed for this visit.  Gen: resting  comfortably, no acute distress HEENT: no scleral icterus, pupils equal round and reactive, no palptable cervical adenopathy,  CV Resp: Clear to auscultation bilaterally GI: abdomen is soft, non-tender, non-distended, normal bowel sounds, no hepatosplenomegaly MSK: extremities are warm, no edema.  Skin: warm, no rash Neuro:  no focal deficits Psych: appropriate affect   Diagnostic Studies 05/2001 cath ANGIOGRAPHIC DATA: 1. Ventriculography  was performed in the RAO projection.  Overall left    ventricular function was preserved and no segmental abnormalities,    contraction were identified.  Ejection fraction was calculated at 61%.  I    could not appreciate significant mitral regurgitation.   2. The left main coronary artery appeared free of critical disease.   3. The left anterior descending artery coursed to the apex.  There were four    small diagonal branches.  The LAD as well as the other terminal branches of    the coronary artery all tapered rather quickly and were fairly small in    caliber.   4. There is a small ramus that was free of critical disease.   5. There was a circumflex that provided two marginals that appeared to be free    of significant disease.  Again, the vessels terminated distally as small    vessels.   6. The right coronary artery was a dominant vessel.  There was a small acute    marginal Lindsay Petty in terms of caliber.  There was a moderate sized    posterolateral Lindsay Petty.  All of these were without obvious focal narrowing.   CONCLUSIONS: 1. Preserved left ventricular function. 2. 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   Feb 25 2015 clinic EKG (performed and reviewed): NSR   Jan 2017 echo Study Conclusions  - Left ventricle: The cavity size was normal. Wall thickness was at   the upper limits of normal. Systolic function was vigorous. The   estimated ejection fraction was in  the range of 60% to 65%. Wall   motion was normal; there were no regional wall motion   abnormalities. Doppler parameters are consistent with abnormal   left ventricular relaxation (grade 1 diastolic dysfunction).   Normal filling pressures. - Mitral valve: There was trivial regurgitation. - Right atrium: Central venous pressure (est): 3 mm Hg. - Tricuspid valve: There was trivial regurgitation. - Pulmonary arteries: PA peak pressure: 27 mm Hg (S). - Pericardium, extracardiac: There was no pericardial effusion.  Impressions:  - Upper normal LV wall thickness with LVEF 60-65%. Grade 1   diastolic dysfunction with normal filling pressures. Trivial   mitral and tricuspid regurgitation. Normal PASP 27 mmHg.    04/2016 Event monitor Telemetry tracings show sinus rhythm with occasional PACs Reported symptoms correlate to NSR with PACs. No significant arrhythmias     05/2019 echo IMPRESSIONS     1. Left ventricular ejection fraction, by estimation, is 65 to 70%. The  left ventricle has hyperdynamic function. The left ventricle has no  regional wall motion abnormalities. Left ventricular diastolic parameters  are consistent with Grade I diastolic  dysfunction (impaired relaxation).   2. Right ventricular systolic function is normal. The right ventricular  size is normal.   3. Left atrial size was mildly dilated.   4. The mitral valve is normal in structure. No evidence of mitral valve  regurgitation. No evidence of mitral stenosis.   5. The aortic valve is tricuspid. Aortic valve regurgitation is not  visualized. No aortic stenosis is present.   6. The inferior vena cava is normal in size with greater than 50%  respiratory variability, suggesting right atrial pressure of 3 mmHg.     Assessment and Plan  1. Chest pain -  previous cath with minimal disease. - no symptoms, continue to monitor   2. Palpitations -symptoms have resolved, continue nadolol  3. HTN -elevated in  clinic today, she will call home bp's later this week. Would add losartan if additional agent is needed     4. LE edema - overall controlled, continue prn lasix and compression stockings.    5. Hyperlipidemia - intolerant to statins and zetia, remains reluctant for repatha or praluent - repeat lipid panel  Off diuretics, can d/c her KCl and Mg supplements. Recheck levels 6 weeks to be sure stable    Arnoldo Lenis, M.D.

## 2021-10-28 NOTE — Patient Instructions (Addendum)
Medication Instructions:  Stop Potassium  Stop Magnesium  Continue all other medications.     Labwork: BMET, Mg - orders given today  Please do in 6 weeks (around 12/02/21) Office will contact with results via phone, letter or mychart.     Testing/Procedures: none  Follow-Up: 6 months   Any Other Special Instructions Will Be Listed Below (If Applicable).   If you need a refill on your cardiac medications before your next appointment, please call your pharmacy.

## 2021-11-03 ENCOUNTER — Other Ambulatory Visit: Payer: Self-pay | Admitting: Cardiology

## 2021-12-04 DIAGNOSIS — Z79899 Other long term (current) drug therapy: Secondary | ICD-10-CM | POA: Diagnosis not present

## 2021-12-12 ENCOUNTER — Encounter: Payer: Self-pay | Admitting: *Deleted

## 2021-12-23 DIAGNOSIS — R7989 Other specified abnormal findings of blood chemistry: Secondary | ICD-10-CM | POA: Diagnosis not present

## 2021-12-23 DIAGNOSIS — N1831 Chronic kidney disease, stage 3a: Secondary | ICD-10-CM | POA: Diagnosis not present

## 2021-12-29 ENCOUNTER — Other Ambulatory Visit: Payer: Self-pay | Admitting: Cardiology

## 2022-01-12 IMAGING — MG MM DIGITAL SCREENING BILAT W/ TOMO AND CAD
8 series · 8 of 24 positions shown · non-contrast
Comparison: Previous exam(s).

CLINICAL DATA: Screening.

EXAM:
DIGITAL SCREENING BILATERAL MAMMOGRAM WITH TOMOSYNTHESIS AND CAD
TECHNIQUE: Bilateral screening digital craniocaudal and mediolateral oblique
mammograms were obtained. Bilateral screening digital breast
tomosynthesis was performed. The images were evaluated with
computer-aided detection.

[L CC synth-2D]
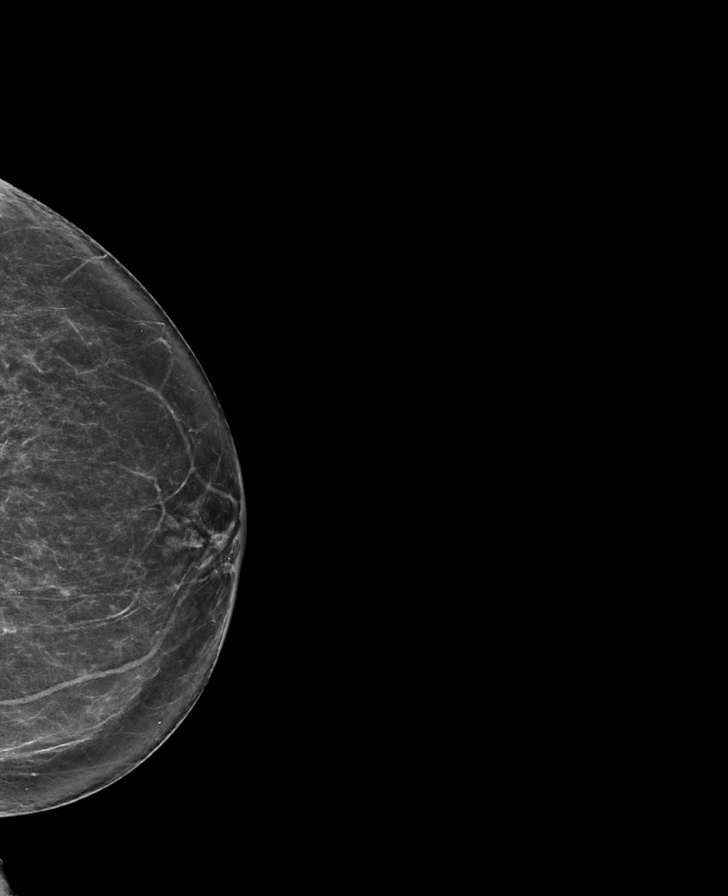

[L MLO synth-2D]
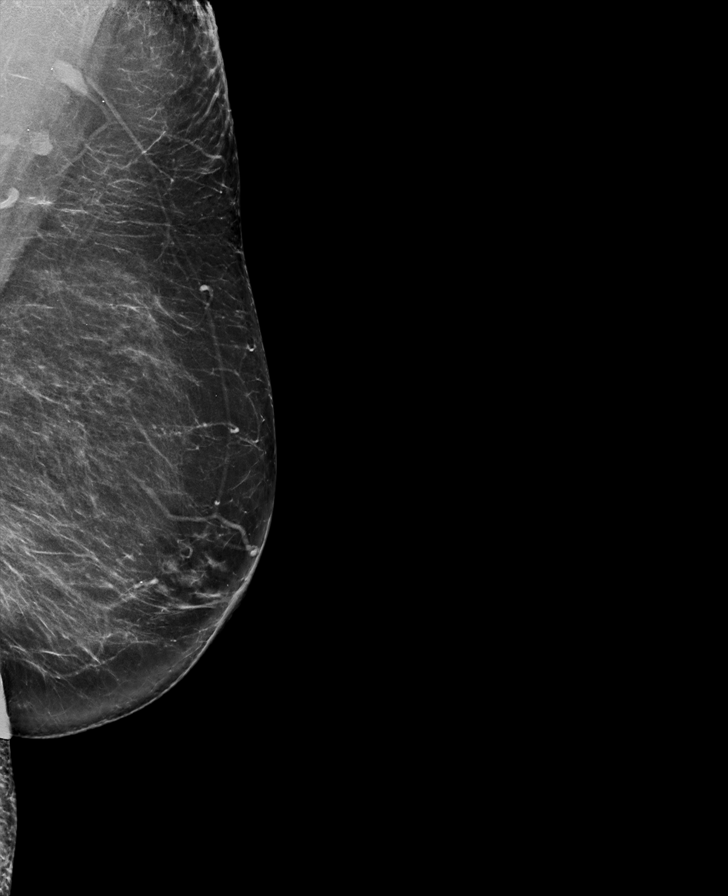

[R MLO synth-2D]
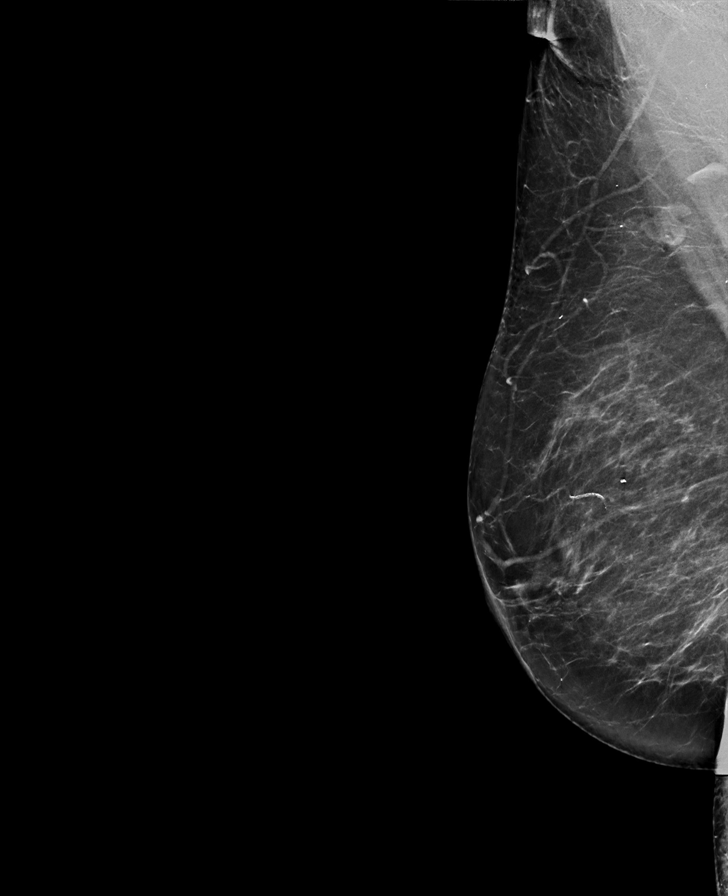

[R CC synth-2D]
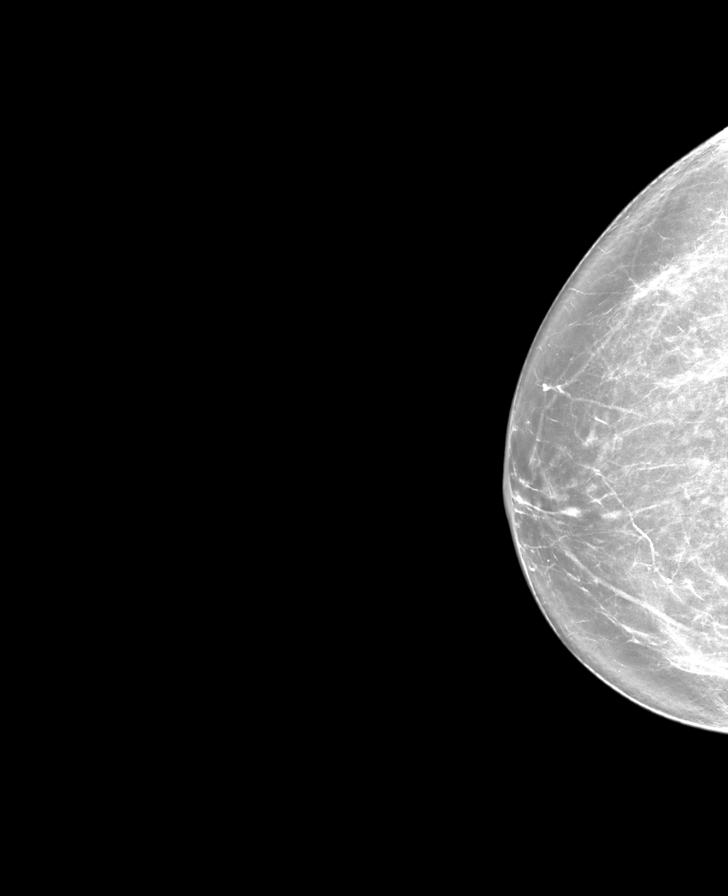

[R MLO tomo · tomo slice 39/77.0]
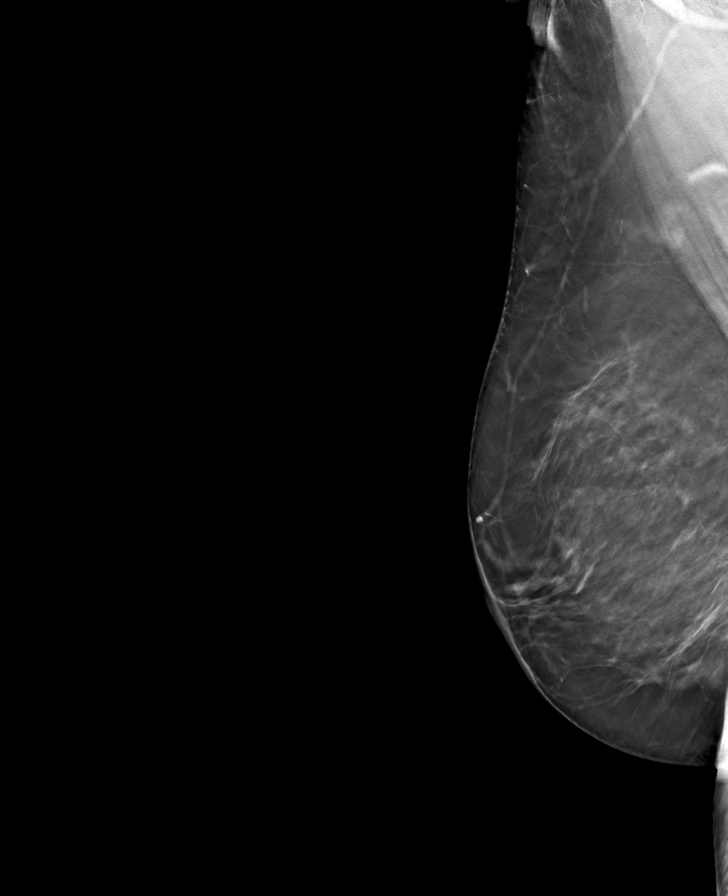

[L CC tomo · tomo slice 35/68.0]
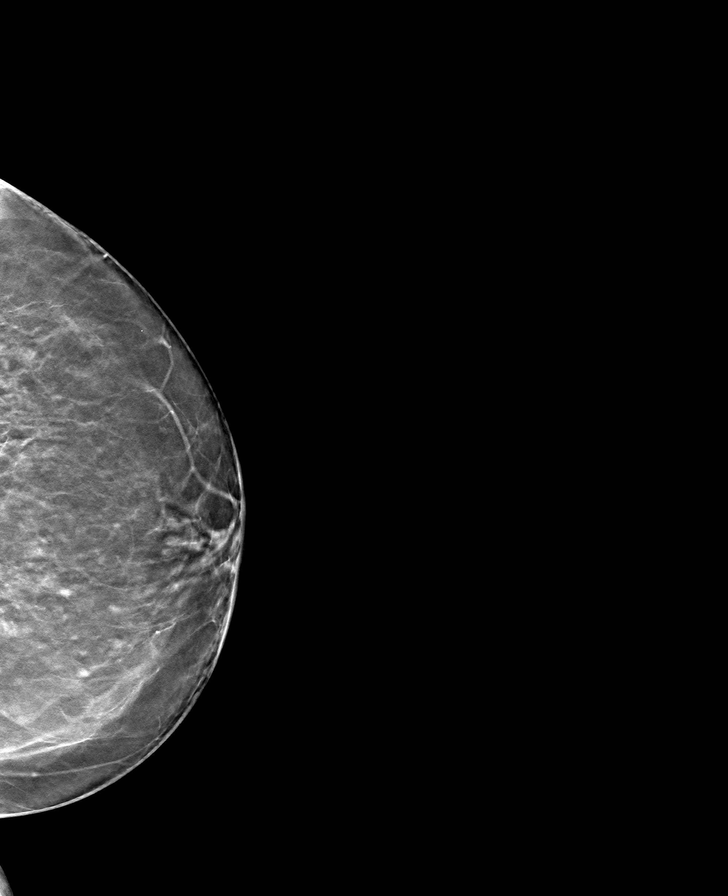

[R CC tomo · tomo slice 35/70.0]
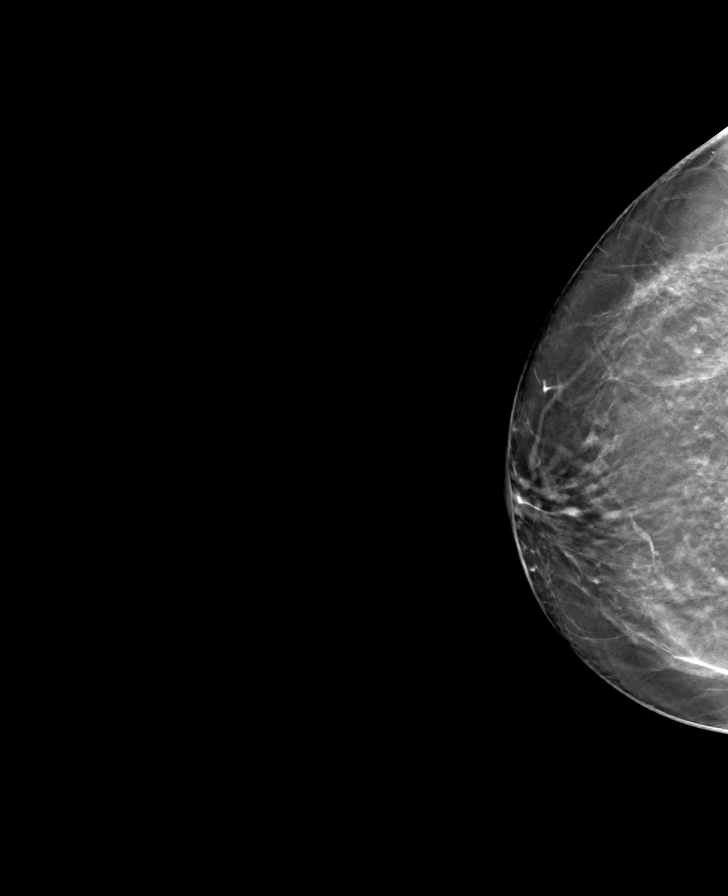

[L MLO tomo · tomo slice 40/79.0]
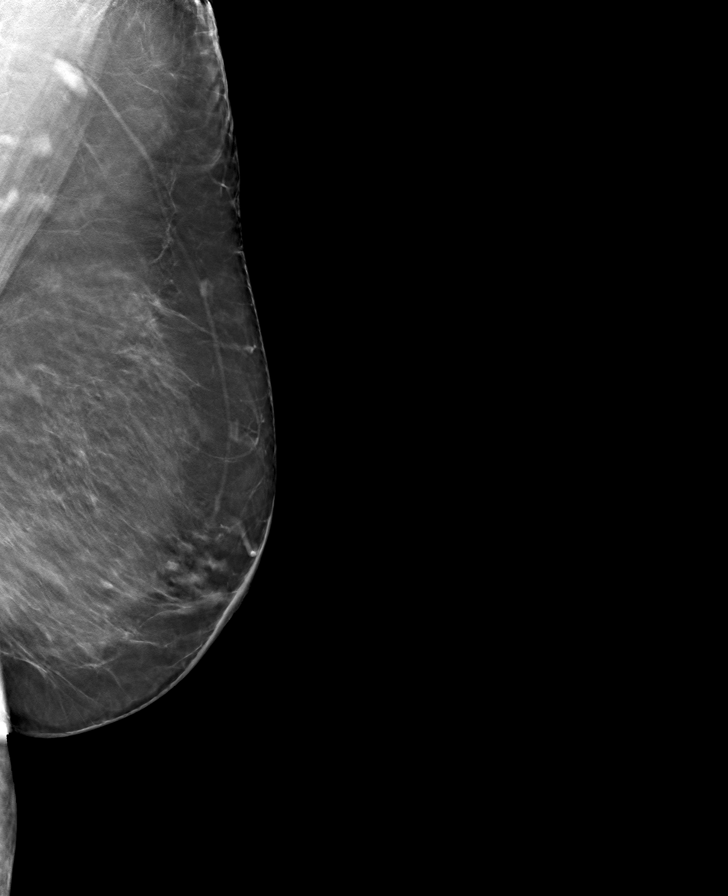

[8 of 24 positions shown; findings below may reference images not displayed]

ACR Breast Density Category b: There are scattered areas of
fibroglandular density.
FINDINGS: There are no findings suspicious for malignancy.
IMPRESSION: No mammographic evidence of malignancy. A result letter of this
screening mammogram will be mailed directly to the patient.

RECOMMENDATION:
Screening mammogram in one year. (Code:51-O-LD2)

BI-RADS CATEGORY  1: Negative.

## 2022-05-11 ENCOUNTER — Encounter: Payer: Self-pay | Admitting: Cardiology

## 2022-05-11 ENCOUNTER — Ambulatory Visit: Payer: Medicare Other | Attending: Cardiology | Admitting: Cardiology

## 2022-05-11 VITALS — BP 138/78 | HR 72 | Ht 59.0 in | Wt 167.0 lb

## 2022-05-11 DIAGNOSIS — I10 Essential (primary) hypertension: Secondary | ICD-10-CM | POA: Insufficient documentation

## 2022-05-11 DIAGNOSIS — E782 Mixed hyperlipidemia: Secondary | ICD-10-CM | POA: Diagnosis not present

## 2022-05-11 DIAGNOSIS — Z79899 Other long term (current) drug therapy: Secondary | ICD-10-CM | POA: Diagnosis not present

## 2022-05-11 DIAGNOSIS — R0789 Other chest pain: Secondary | ICD-10-CM

## 2022-05-11 DIAGNOSIS — R002 Palpitations: Secondary | ICD-10-CM | POA: Diagnosis not present

## 2022-05-11 MED ORDER — FUROSEMIDE 40 MG PO TABS: 40.0000 mg | ORAL_TABLET | Freq: Every day | ORAL | 1 refills | Status: AC | PRN

## 2022-05-11 NOTE — Progress Notes (Signed)
Clinical Summary Lindsay Petty is a 82 y.o.female seen today for follow up of the following medical problems.   1. History of chest pain/CAD - history of prior chest pain symptoms. Cath in 2003 without significant disease, she had small distal vessel disease -no chest pain, no SOB/DOE. - she stopped ASA due to bruising.      2. Palpitations  - 04/2016 event monitor showed symptoms correlated with SR and PACs.Taking nadolol 40mg  bid   - no palpitations.   3. HTN -previously had increased edema on higher norvasc dose, improved on lower dose however back on norvasc 10 without recurrent issues.       - Has had some issues with low K on chlorthalidone, some uptrend in Cr. It was stopped previously.  -  cough previously on lisinopril in the past - home bp's 130s/70s, often home bp's better than clinic bp     4. Hyperlipidemia - statin intolerance, did not tolerate zetia - reluctant to start pcsk9 inhibitor  - TC 281 TG 330 HDL 47 LDL 168 - followed in lipid clinic - has been reluctant for pcsk9 I    - 07/2020 TC 208 HDL 63 LDL 129  - remains resistant toward pcsk31mAb 09/2021 TC 260 TG 258 HDL 51 LDL 161     5. LE edema -05/2019 echo LVEF 65-70%, grade I DD   - swelling has improved off diclofenac.  - wears compresson stockings.  - needs refill on her prn lasix    SH: Lindsay Petty Kitchen Keeps her grandkids 28 and 22 years old, lives next door. Has 4 great grand kids Past Medical History:  Diagnosis Date   Atropine adverse reaction    atropine and sensitivity by history   Chest pain    cardiac catheterization 2003, no major epicardial disease, small distal vessels I   Ejection fraction    65%.Lindsay Petty Kitchenecho April 2003   GERD (gastroesophageal reflux disease)    Hx of cholecystectomy 03/2006   lap    Hyperlipemia    Hypertension    renal artery ultrasound, January, 2011, no significant renal artery stenosis,  ... medications make her feel tired   Jaw pain    etiology uncertain    Palpitations    Statin intolerance    muscle weakness   Vertigo    Mild positional vertigo     Allergies  Allergen Reactions   Bee Venom Anaphylaxis   Amoxicillin Hives   Atropine     Heart race    Doxycycline Swelling   Band-Aid Plus Antibiotic [Bacitracin-Polymyxin B] Rash   Latex Rash     Current Outpatient Medications  Medication Sig Dispense Refill   ALPRAZolam (XANAX) 0.5 MG tablet Take 0.5 mg by mouth 2 (two) times daily as needed for anxiety.      amLODipine (NORVASC) 5 MG tablet Take 1 tablet (5 mg total) by mouth 2 (two) times daily. 60 tablet 6   Ascorbic Acid (VITAMIN C) 1000 MG tablet Take 1,000 mg by mouth daily.     calcium carbonate (TUMS - DOSED IN MG ELEMENTAL CALCIUM) 500 MG chewable tablet Chew 1 tablet by mouth daily as needed.      Cholecalciferol (VITAMIN D3) 1000 UNITS CAPS Take 1 capsule by mouth daily.     Coenzyme Q10 (COQ10) 100 MG CAPS Take 1 capsule by mouth daily.       esomeprazole (NEXIUM) 20 MG capsule Take 20 mg by mouth daily.      Flaxseed, Linseed, (FLAXSEED  OIL) 1000 MG CAPS Take 1 capsule by mouth daily.     magnesium oxide (MAG-OX) 400 (240 Mg) MG tablet Take 400 mg by mouth daily.     Multiple Vitamin (MULTIVITAMIN) tablet Take 1 tablet by mouth daily.       nadolol (CORGARD) 40 MG tablet TAKE (1) TABLET TWICE DAILY. 180 tablet 2   potassium chloride (KLOR-CON) 20 MEQ packet Take 20 mEq by mouth every other day.     pyridOXINE (VITAMIN B-6) 100 MG tablet Take 100 mg by mouth daily.     zinc gluconate 50 MG tablet Take 50 mg by mouth daily.     acetaminophen (TYLENOL) 500 MG tablet Take 500 mg by mouth every 6 (six) hours as needed. (Patient not taking: Reported on 05/11/2022)     furosemide (LASIX) 40 MG tablet Take 1 tablet (40 mg total) by mouth daily as needed (swelling). 90 tablet 1   No current facility-administered medications for this visit.     Past Surgical History:  Procedure Laterality Date   BREAST BIOPSY     x2 for  benign disease   CARDIAC CATHETERIZATION     CHOLECYSTECTOMY     (lap) 2008.   COLONOSCOPY N/A 11/22/2013   Procedure: COLONOSCOPY;  Surgeon: Rogene Houston, MD;  Location: [facility];  Service: Endoscopy;  Laterality: N/A;  200   rt knee arthroscopy       Allergies  Allergen Reactions   Bee Venom Anaphylaxis   Amoxicillin Hives   Atropine     Heart race    Doxycycline Swelling   Band-Aid Plus Antibiotic [Bacitracin-Polymyxin B] Rash   Latex Rash      Family History  Problem Relation Age of Onset   Breast cancer Sister    Coronary artery disease Other        unknown     Social History Ms. Lindsay Petty reports that she has never smoked. She has never been exposed to tobacco smoke. She has never used smokeless tobacco. Lindsay Petty reports no history of alcohol use.   Review of Systems CONSTITUTIONAL: No weight loss, fever, chills, weakness or fatigue.  HEENT: Eyes: No visual loss, blurred vision, double vision or yellow sclerae.No hearing loss, sneezing, congestion, runny nose or sore throat.  SKIN: No rash or itching.  CARDIOVASCULAR: per hpi RESPIRATORY: No shortness of breath, cough or sputum.  GASTROINTESTINAL: No anorexia, nausea, vomiting or diarrhea. No abdominal pain or blood.  GENITOURINARY: No burning on urination, no polyuria NEUROLOGICAL: No headache, dizziness, syncope, paralysis, ataxia, numbness or tingling in the extremities. No change in bowel or bladder control.  MUSCULOSKELETAL: No muscle, back pain, joint pain or stiffness.  LYMPHATICS: No enlarged nodes. No history of splenectomy.  PSYCHIATRIC: No history of depression or anxiety.  ENDOCRINOLOGIC: No reports of sweating, cold or heat intolerance. No polyuria or polydipsia.  Lindsay Petty Kitchen   Physical Examination Vitals:   05/11/22 1128 05/11/22 1223  BP: (!) 162/78 138/78  Pulse: 72   SpO2: 97%    Filed Weights   05/11/22 1128  Weight: 167 lb (75.8 kg)    Gen: resting comfortably, no acute  distress HEENT: no scleral icterus, pupils equal round and reactive, no palptable cervical adenopathy,  CV: RRR, no m/rg, no jvd Resp: Clear to auscultation bilaterally GI: abdomen is soft, non-tender, non-distended, normal bowel sounds, no hepatosplenomegaly MSK: extremities are warm, no edema.  Skin: warm, no rash Neuro:  no focal deficits Psych: appropriate affect   Diagnostic Studies  05/2001  cath ANGIOGRAPHIC DATA: 1. Ventriculography was performed in the RAO projection.  Overall left    ventricular function was preserved and no segmental abnormalities,    contraction were identified.  Ejection fraction was calculated at 61%.  I    could not appreciate significant mitral regurgitation.   2. The left main coronary artery appeared free of critical disease.   3. The left anterior descending artery coursed to the apex.  There were four    small diagonal branches.  The LAD as well as the other terminal branches of    the coronary artery all tapered rather quickly and were fairly small in    caliber.   4. There is a small ramus that was free of critical disease.   5. There was a circumflex that provided two marginals that appeared to be free    of significant disease.  Again, the vessels terminated distally as small    vessels.   6. The right coronary artery was a dominant vessel.  There was a small acute    marginal Ethel Veronica in terms of caliber.  There was a moderate sized    posterolateral Zalika Tieszen.  All of these were without obvious focal narrowing.   CONCLUSIONS: 1. Preserved left ventricular function. 2. 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   Feb 25 2015 clinic EKG (performed and reviewed): NSR   Jan 2017 echo Study Conclusions  - Left ventricle: The cavity size was normal. Wall thickness was at   the upper limits of normal. Systolic function was vigorous. The   estimated ejection fraction was in  the range of 60% to 65%. Wall   motion was normal; there were no regional wall motion   abnormalities. Doppler parameters are consistent with abnormal   left ventricular relaxation (grade 1 diastolic dysfunction).   Normal filling pressures. - Mitral valve: There was trivial regurgitation. - Right atrium: Central venous pressure (est): 3 mm Hg. - Tricuspid valve: There was trivial regurgitation. - Pulmonary arteries: PA peak pressure: 27 mm Hg (S). - Pericardium, extracardiac: There was no pericardial effusion.  Impressions:  - Upper normal LV wall thickness with LVEF 60-65%. Grade 1   diastolic dysfunction with normal filling pressures. Trivial   mitral and tricuspid regurgitation. Normal PASP 27 mmHg.    04/2016 Event monitor Telemetry tracings show sinus rhythm with occasional PACs Reported symptoms correlate to NSR with PACs. No significant arrhythmias     05/2019 echo IMPRESSIONS     1. Left ventricular ejection fraction, by estimation, is 65 to 70%. The  left ventricle has hyperdynamic function. The left ventricle has no  regional wall motion abnormalities. Left ventricular diastolic parameters  are consistent with Grade I diastolic  dysfunction (impaired relaxation).   2. Right ventricular systolic function is normal. The right ventricular  size is normal.   3. Left atrial size was mildly dilated.   4. The mitral valve is normal in structure. No evidence of mitral valve  regurgitation. No evidence of mitral stenosis.   5. The aortic valve is tricuspid. Aortic valve regurgitation is not  visualized. No aortic stenosis is present.   6. The inferior vena cava is normal in size with greater than 50%  respiratory variability, suggesting right atrial pressure of 3 mmHg.        Assessment and Plan   1. Chest pain -  previous cath with minimal disease. - no recent symptoms, continue to monitor  2. Palpitations -no symptoms, continue nadolol - EKG today shows NSR    3. HTN -typically has white coat HTN. Bp mildly elevated here by recheck, home numbers are at goal - continue current meds     4. LE edema - controlled, continue current therapy.    5. Hyperlipidemia - intolerant to statins and zetia, remains reluctant for repatha or praluent - we will repeat lipid panel, continue to discuss pcsk9i       Arnoldo Lenis, M.D.

## 2022-05-11 NOTE — Patient Instructions (Signed)
Medication Instructions:  Your physician recommends that you continue on your current medications as directed. Please refer to the Current Medication list given to you today.   Labwork: CMET CBC TSH LIPID A1C MAG  Testing/Procedures: None  Follow-Up: Follow up with Dr. Harl Bowie in 6 months.   Any Other Special Instructions Will Be Listed Below (If Applicable).     If you need a refill on your cardiac medications before your next appointment, please call your pharmacy.

## 2022-05-19 ENCOUNTER — Other Ambulatory Visit (HOSPITAL_COMMUNITY)
Admission: RE | Admit: 2022-05-19 | Discharge: 2022-05-19 | Disposition: A | Discharge status: Home / Self Care | Payer: Medicare Other | Source: Ambulatory Visit | Attending: Cardiology | Admitting: Cardiology

## 2022-05-19 DIAGNOSIS — R0789 Other chest pain: Secondary | ICD-10-CM | POA: Diagnosis not present

## 2022-05-19 DIAGNOSIS — Z79899 Other long term (current) drug therapy: Secondary | ICD-10-CM | POA: Diagnosis not present

## 2022-05-19 DIAGNOSIS — I10 Essential (primary) hypertension: Secondary | ICD-10-CM

## 2022-05-19 LAB — CBC
HCT: 38.1 % (ref 36.0–46.0)
Hemoglobin: 12.8 g/dL (ref 12.0–15.0)
MCH: 31.8 pg (ref 26.0–34.0)
MCHC: 33.6 g/dL (ref 30.0–36.0)
MCV: 94.5 fL (ref 80.0–100.0)
Platelets: 232 10*3/uL (ref 150–400)
RBC: 4.03 MIL/uL (ref 3.87–5.11)
RDW: 13.6 % (ref 11.5–15.5)
WBC: 5.8 10*3/uL (ref 4.0–10.5)
nRBC: 0 % (ref 0.0–0.2)

## 2022-05-19 LAB — COMPREHENSIVE METABOLIC PANEL
ALT: 29 U/L (ref 0–44)
AST: 31 U/L (ref 15–41)
Albumin: 3.9 g/dL (ref 3.5–5.0)
Alkaline Phosphatase: 70 U/L (ref 38–126)
Anion gap: 9 (ref 5–15)
BUN: 16 mg/dL (ref 8–23)
CO2: 26 mmol/L (ref 22–32)
Calcium: 9.1 mg/dL (ref 8.9–10.3)
Chloride: 104 mmol/L (ref 98–111)
Creatinine, Ser: 0.94 mg/dL (ref 0.44–1.00)
GFR, Estimated: >60 mL/min (ref >60)
Glucose, Bld: 109 mg/dL — ABNORMAL HIGH (ref 70–99)
Potassium: 3.8 mmol/L (ref 3.5–5.1)
Sodium: 139 mmol/L (ref 135–145)
Total Bilirubin: 0.7 mg/dL (ref 0.3–1.2)
Total Protein: 7 g/dL (ref 6.5–8.1)

## 2022-05-19 LAB — LIPID PANEL
Cholesterol: 244 mg/dL — ABNORMAL HIGH (ref 0–200)
HDL: 54 mg/dL (ref >40)
LDL Cholesterol: 149 mg/dL — ABNORMAL HIGH (ref 0–99)
Total CHOL/HDL Ratio: 4.5 RATIO
Triglycerides: 205 mg/dL — ABNORMAL HIGH (ref <150)
VLDL: 41 mg/dL — ABNORMAL HIGH (ref 0–40)

## 2022-05-19 LAB — TSH: TSH: 3.288 u[IU]/mL (ref 0.350–4.500)

## 2022-05-19 LAB — MAGNESIUM: Magnesium: 2.2 mg/dL (ref 1.7–2.4)

## 2022-05-20 LAB — HEMOGLOBIN A1C
Hgb A1c MFr Bld: 5.5 % (ref 4.8–5.6)
Mean Plasma Glucose: 111 mg/dL

## 2022-06-08 ENCOUNTER — Telehealth: Payer: Self-pay

## 2022-06-08 IMAGING — CT CT ABD-PELV W/ CM
2 of 4 series · 16 of 46 positions shown, 18 images · IV contrast (agent unspecified)
Comparison: None.

CLINICAL DATA: LLQ abdominal pain

EXAM:
CT ABDOMEN AND PELVIS WITH CONTRAST
TECHNIQUE: Multidetector CT imaging of the abdomen and pelvis was performed
using the standard protocol following bolus administration of
intravenous contrast.

[Series 2: axial st · axial · 0.86mm/px · z∈[-230,+170]mm · 13 of 88 slices shown, 15 images]
[im 4/88  soft-tissue]
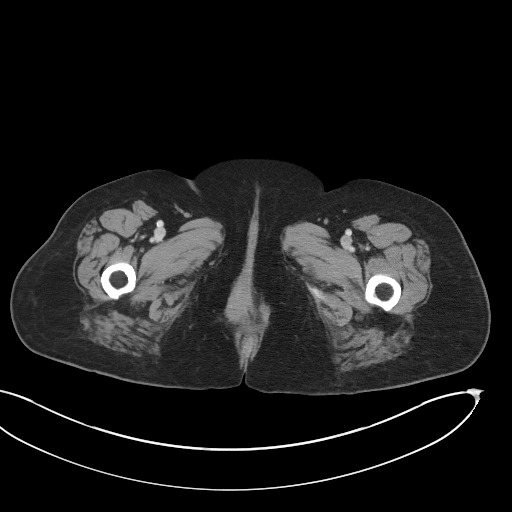
[im 4/88  bone]
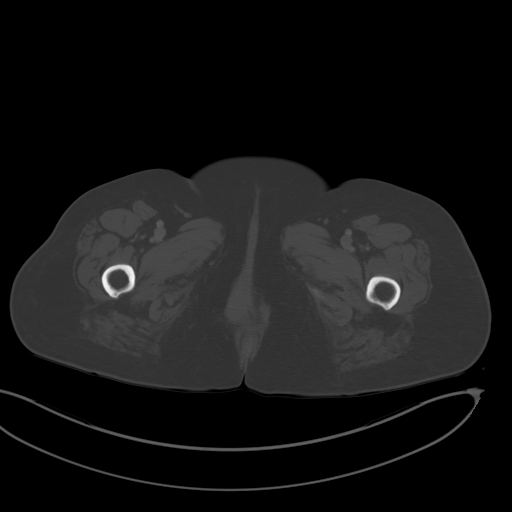
[im 11/88  soft-tissue]
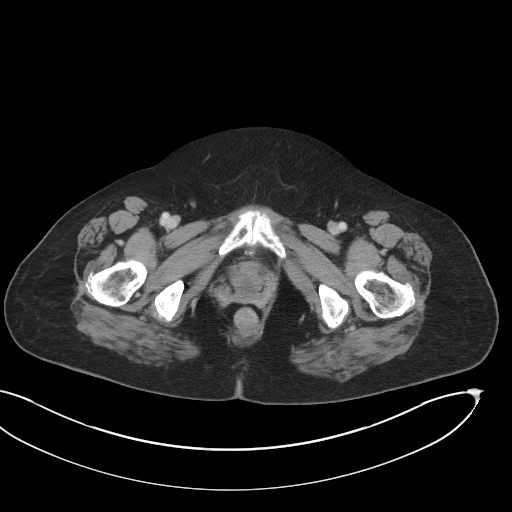
[im 19/88  soft-tissue]
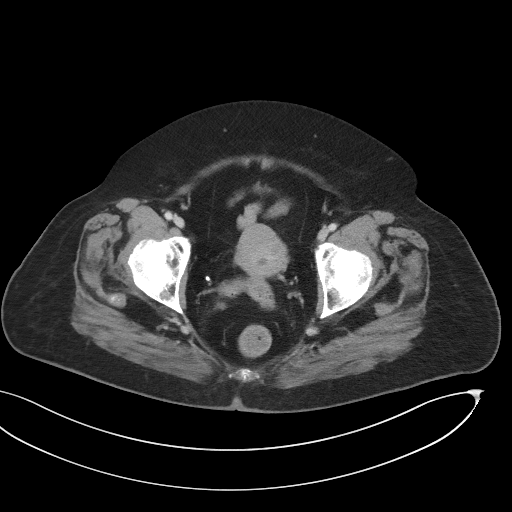
[im 26/88  soft-tissue]
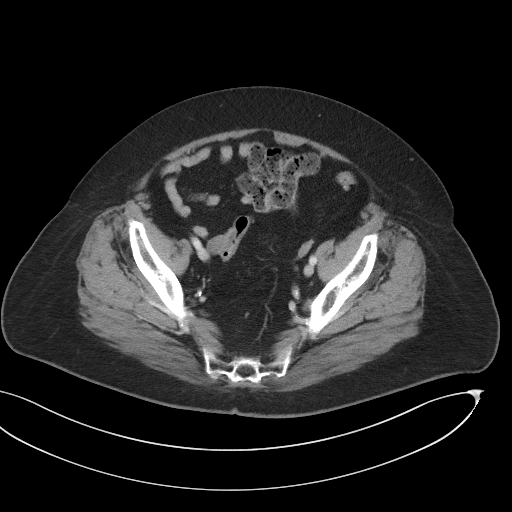
[im 30/88  soft-tissue]
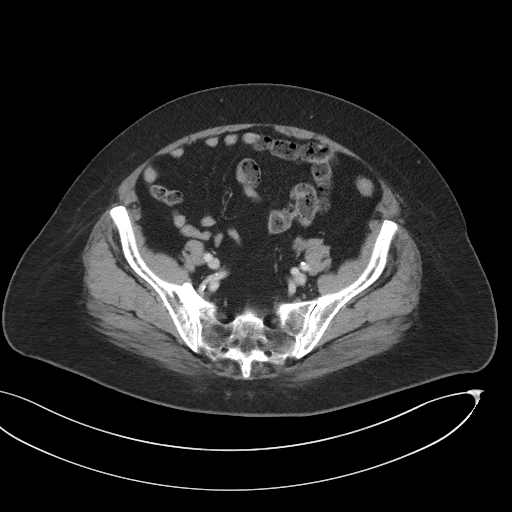
[im 37/88  soft-tissue]
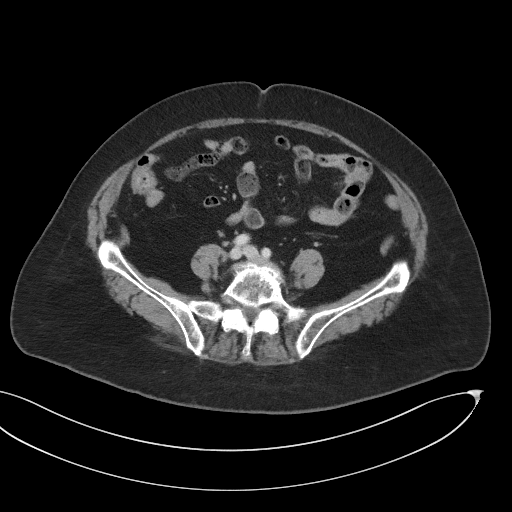
[im 44/88  soft-tissue]
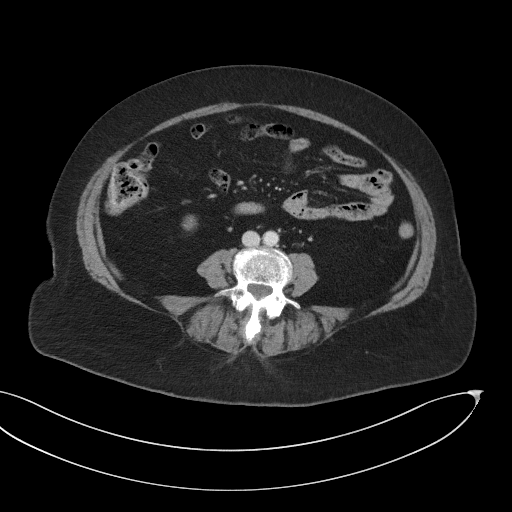
[im 51/88  soft-tissue]
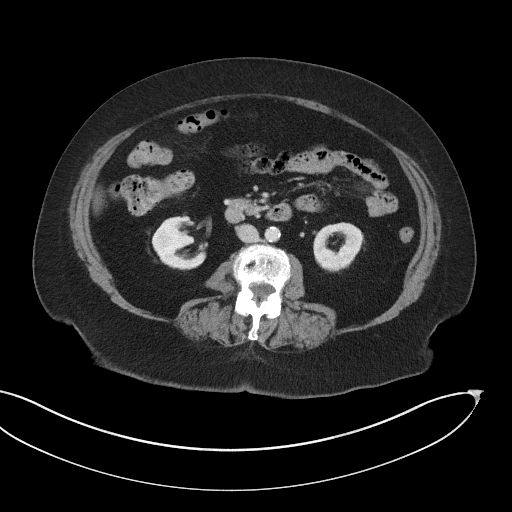
[im 59/88  soft-tissue]
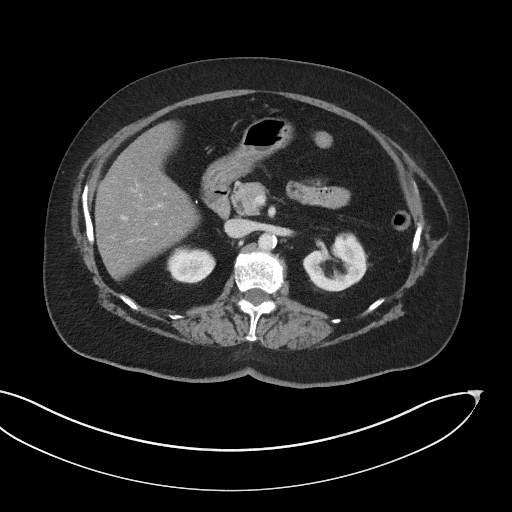
[im 59/88  bone]
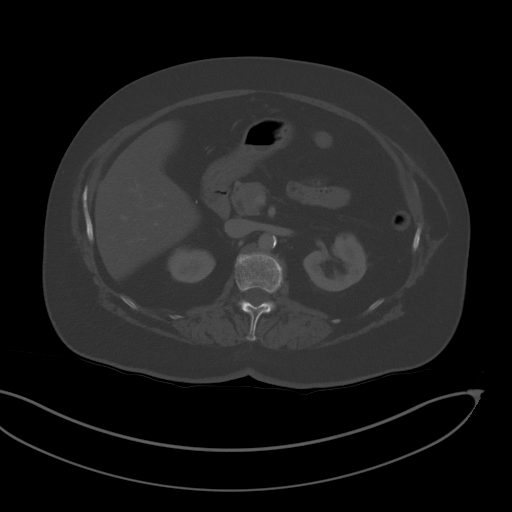
[im 62/88  soft-tissue]
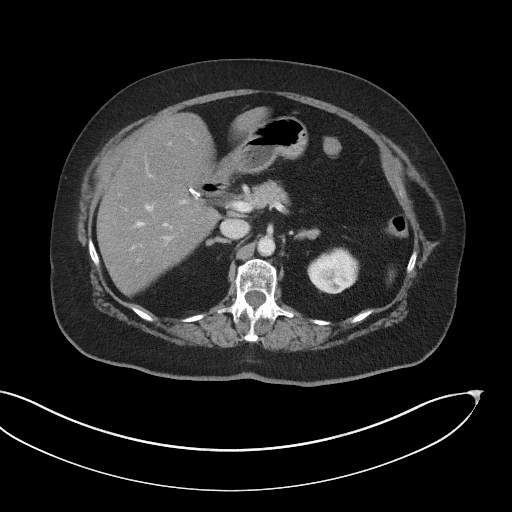
[im 69/88  soft-tissue]
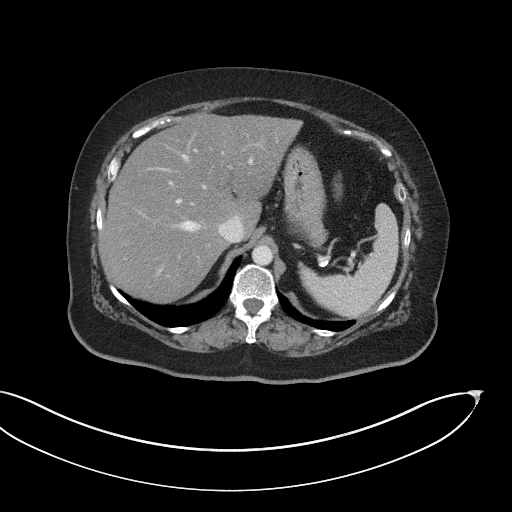
[im 77/88  soft-tissue]
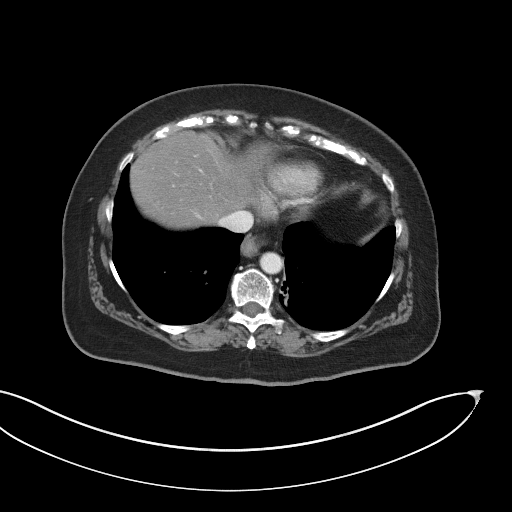
[im 84/88  soft-tissue]
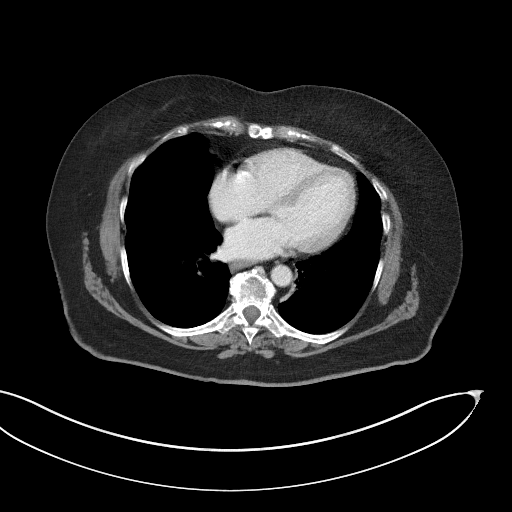

[Series 5: coronal st · coronal · 0.77mm/px · 3 of 104 slices shown]
[im 35/104  soft-tissue]
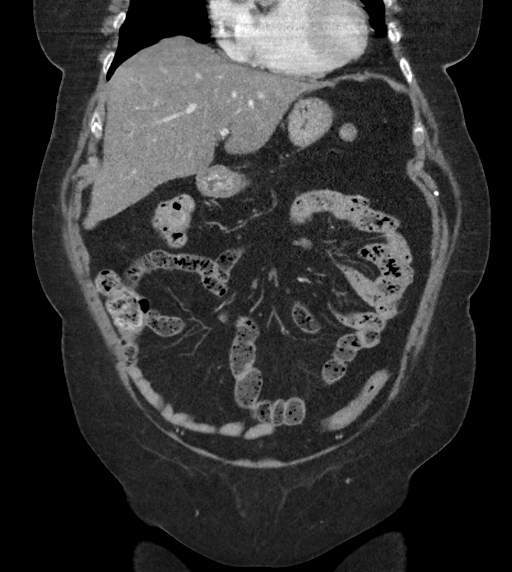
[im 46/104  soft-tissue]
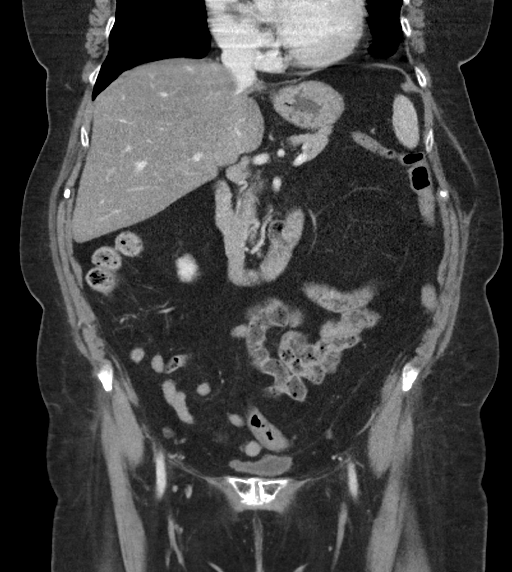
[im 58/104  soft-tissue]
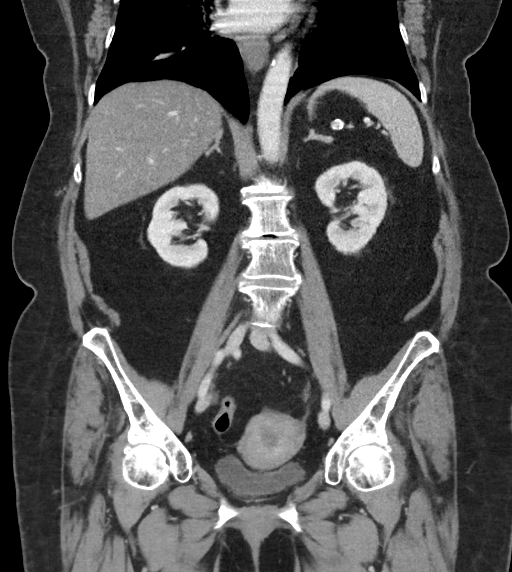

[16 of 46 positions shown; findings below may reference images not displayed]

RADIATION DOSE REDUCTION: This exam was performed according to the
departmental dose-optimization program which includes automated
exposure control, adjustment of the mA and/or kV according to
patient size and/or use of iterative reconstruction technique.

CONTRAST:  100mL OMNIPAQUE IOHEXOL 300 MG/ML  SOLN
FINDINGS: Lower chest: No acute abnormality.

Hepatobiliary: Hepatic steatosis.  Prior cholecystectomy.

Pancreas: Unremarkable. No pancreatic ductal dilatation or
surrounding inflammatory changes.

Spleen: Normal in size without focal abnormality.

Adrenals/Urinary Tract: Adrenal glands are unremarkable. No
hydronephrosis or nephrolithiasis. Tiny left renal cysts. The
bladder is minimally distended.

Stomach/Bowel: Small hiatal hernia. The stomach is otherwise within
normal limits. There is no evidence of bowel obstruction.Mildly
prominent appendix measuring up to 8-9 mm without any
periappendiceal stranding, gas, or fluid collection. Scattered
colonic diverticula. No diverticulitis.

Vascular/Lymphatic: Aortoiliac atherosclerosis. No AAA. No
lymphadenopathy.

Reproductive: Anterior uterine fibroid.  No adnexal mass.

Other: Tiny fat containing umbilical hernia. No bowel containing
hernia. No ascites. No free air.

Musculoskeletal: No acute osseous abnormality. No suspicious lytic
or blastic lesions. Multilevel degenerative changes of the spine
with grade 1 degenerative anterolisthesis at L4-L5. There is mild to
moderate bilateral hip osteoarthritis.
IMPRESSION: Mildly prominent appendix measuring up to 8-9 mm, though without any
periappendiceal inflammation or fluid collection. Correlate with
exam and laboratory findings. No other acute findings.

Scattered diverticula.  No evidence of diverticulitis.

Small hiatal hernia.

## 2022-06-08 NOTE — Telephone Encounter (Signed)
Patient notified and verbalized understanding. Patient had no questions or concerns at this time. PCP copied 

## 2022-06-08 NOTE — Telephone Encounter (Signed)
-----   Message from Antoine Poche, MD sent at 06/06/2022  8:26 AM EDT ----- Cholesterol remains too high, would continue to recommend lipid clinic evaluation to consider the injection medication we had discussed in clinic if patient is willing to try  Dominga Ferry MD

## 2022-06-17 ENCOUNTER — Other Ambulatory Visit: Payer: Self-pay | Admitting: Cardiology

## 2022-07-06 ENCOUNTER — Other Ambulatory Visit: Payer: Self-pay | Admitting: Cardiology

## 2022-07-30 ENCOUNTER — Telehealth: Payer: Self-pay

## 2022-07-30 NOTE — Telephone Encounter (Signed)
Patient confirmed she is taking potassium daily.

## 2022-07-30 NOTE — Telephone Encounter (Signed)
-----   Message from Antoine Poche, MD sent at 07/28/2022  3:58 PM EDT ----- Regarding: RE: clarify K+ dose Potassium has looked good, I would confirm with patient how she has been taking it and continue  Dominga Ferry MD ----- Message ----- From: Nori Riis, RN Sent: 07/06/2022  11:55 AM EDT To: Antoine Poche, MD Subject: clarify K+ dose                                Clarify K+ dose: Is she taking QD or QOD  We have QOD, pharmacy request is for QD   Thanks

## 2022-08-31 DIAGNOSIS — M7062 Trochanteric bursitis, left hip: Secondary | ICD-10-CM | POA: Diagnosis not present

## 2022-08-31 DIAGNOSIS — M85612 Other cyst of bone, left shoulder: Secondary | ICD-10-CM | POA: Diagnosis not present

## 2022-08-31 DIAGNOSIS — M25512 Pain in left shoulder: Secondary | ICD-10-CM | POA: Diagnosis not present

## 2022-08-31 DIAGNOSIS — M533 Sacrococcygeal disorders, not elsewhere classified: Secondary | ICD-10-CM | POA: Diagnosis not present

## 2022-09-09 DIAGNOSIS — M85612 Other cyst of bone, left shoulder: Secondary | ICD-10-CM | POA: Diagnosis not present

## 2022-09-09 DIAGNOSIS — M5137 Other intervertebral disc degeneration, lumbosacral region: Secondary | ICD-10-CM | POA: Diagnosis not present

## 2022-09-09 DIAGNOSIS — M25512 Pain in left shoulder: Secondary | ICD-10-CM | POA: Diagnosis not present

## 2022-09-09 DIAGNOSIS — M7062 Trochanteric bursitis, left hip: Secondary | ICD-10-CM | POA: Diagnosis not present

## 2022-09-09 DIAGNOSIS — M533 Sacrococcygeal disorders, not elsewhere classified: Secondary | ICD-10-CM | POA: Diagnosis not present

## 2022-10-07 DIAGNOSIS — M533 Sacrococcygeal disorders, not elsewhere classified: Secondary | ICD-10-CM | POA: Diagnosis not present

## 2022-10-07 DIAGNOSIS — M7062 Trochanteric bursitis, left hip: Secondary | ICD-10-CM | POA: Diagnosis not present

## 2022-10-07 DIAGNOSIS — M5137 Other intervertebral disc degeneration, lumbosacral region: Secondary | ICD-10-CM | POA: Diagnosis not present

## 2022-10-07 DIAGNOSIS — M4727 Other spondylosis with radiculopathy, lumbosacral region: Secondary | ICD-10-CM | POA: Diagnosis not present

## 2022-10-07 DIAGNOSIS — M85612 Other cyst of bone, left shoulder: Secondary | ICD-10-CM | POA: Diagnosis not present

## 2022-10-13 DIAGNOSIS — H35363 Drusen (degenerative) of macula, bilateral: Secondary | ICD-10-CM | POA: Diagnosis not present

## 2022-10-21 DIAGNOSIS — E7800 Pure hypercholesterolemia, unspecified: Secondary | ICD-10-CM | POA: Diagnosis not present

## 2022-10-21 DIAGNOSIS — I10 Essential (primary) hypertension: Secondary | ICD-10-CM | POA: Diagnosis not present

## 2022-10-21 DIAGNOSIS — R5383 Other fatigue: Secondary | ICD-10-CM | POA: Diagnosis not present

## 2022-10-21 DIAGNOSIS — Z1331 Encounter for screening for depression: Secondary | ICD-10-CM | POA: Diagnosis not present

## 2022-10-21 DIAGNOSIS — Z79899 Other long term (current) drug therapy: Secondary | ICD-10-CM | POA: Diagnosis not present

## 2022-10-21 DIAGNOSIS — Z0000 Encounter for general adult medical examination without abnormal findings: Secondary | ICD-10-CM | POA: Diagnosis not present

## 2022-10-21 DIAGNOSIS — Z1339 Encounter for screening examination for other mental health and behavioral disorders: Secondary | ICD-10-CM | POA: Diagnosis not present

## 2022-10-21 DIAGNOSIS — Z7189 Other specified counseling: Secondary | ICD-10-CM | POA: Diagnosis not present

## 2022-10-21 DIAGNOSIS — M8580 Other specified disorders of bone density and structure, unspecified site: Secondary | ICD-10-CM | POA: Diagnosis not present

## 2022-10-21 DIAGNOSIS — Z299 Encounter for prophylactic measures, unspecified: Secondary | ICD-10-CM | POA: Diagnosis not present

## 2022-10-22 DIAGNOSIS — M1612 Unilateral primary osteoarthritis, left hip: Secondary | ICD-10-CM | POA: Diagnosis not present

## 2022-10-22 DIAGNOSIS — Z1330 Encounter for screening examination for mental health and behavioral disorders, unspecified: Secondary | ICD-10-CM | POA: Diagnosis not present

## 2022-10-22 DIAGNOSIS — M461 Sacroiliitis, not elsewhere classified: Secondary | ICD-10-CM | POA: Diagnosis not present

## 2022-10-22 DIAGNOSIS — M47816 Spondylosis without myelopathy or radiculopathy, lumbar region: Secondary | ICD-10-CM | POA: Diagnosis not present

## 2022-11-09 DIAGNOSIS — Z13820 Encounter for screening for osteoporosis: Secondary | ICD-10-CM | POA: Diagnosis not present

## 2022-11-09 DIAGNOSIS — E2839 Other primary ovarian failure: Secondary | ICD-10-CM | POA: Diagnosis not present

## 2022-11-09 DIAGNOSIS — Z79899 Other long term (current) drug therapy: Secondary | ICD-10-CM | POA: Diagnosis not present

## 2022-12-01 DIAGNOSIS — M51372 Other intervertebral disc degeneration, lumbosacral region with discogenic back pain and lower extremity pain: Secondary | ICD-10-CM | POA: Diagnosis not present

## 2022-12-01 DIAGNOSIS — M7062 Trochanteric bursitis, left hip: Secondary | ICD-10-CM | POA: Diagnosis not present

## 2022-12-01 DIAGNOSIS — M4727 Other spondylosis with radiculopathy, lumbosacral region: Secondary | ICD-10-CM | POA: Diagnosis not present

## 2022-12-01 DIAGNOSIS — R29898 Other symptoms and signs involving the musculoskeletal system: Secondary | ICD-10-CM | POA: Diagnosis not present

## 2022-12-01 DIAGNOSIS — M533 Sacrococcygeal disorders, not elsewhere classified: Secondary | ICD-10-CM | POA: Diagnosis not present

## 2022-12-11 ENCOUNTER — Ambulatory Visit: Payer: Medicare Other | Attending: Cardiology | Admitting: Cardiology

## 2022-12-11 ENCOUNTER — Encounter: Payer: Self-pay | Admitting: Cardiology

## 2022-12-11 VITALS — BP 136/68 | HR 64 | Ht 59.0 in | Wt 165.0 lb

## 2022-12-11 DIAGNOSIS — I10 Essential (primary) hypertension: Secondary | ICD-10-CM | POA: Insufficient documentation

## 2022-12-11 DIAGNOSIS — R0789 Other chest pain: Secondary | ICD-10-CM | POA: Insufficient documentation

## 2022-12-11 DIAGNOSIS — R002 Palpitations: Secondary | ICD-10-CM | POA: Insufficient documentation

## 2022-12-11 NOTE — Patient Instructions (Signed)

## 2022-12-11 NOTE — Progress Notes (Signed)
Clinical Summary Ms. Threadgill is a 82 y.o.female seen today for follow up of the following medical problems.   1. History of chest pain/CAD - history of prior chest pain symptoms. Cath in 2003 without significant disease, she had small distal vessel disease - she stopped ASA due to bruising.   - no recent chest pains, no SOB/DOE - compliant with meds     2. Palpitations  - 04/2016 event monitor showed symptoms correlated with SR and PACs.Taking nadolol 40mg  bid   - symptoms can occur with standing for long periods of time of dizziness, palpitations.    3. HTN -previously had increased edema on higher norvasc dose, improved on lower dose however back on norvasc 10 without recurrent issues.       - Has had some issues with low K on chlorthalidone, some uptrend in Cr. It was stopped previously.  -  cough previously on lisinopril in the past - home bp's 130s/70s, often home bp's better than clinic bp  - home bp's 130s/60s     4. Hyperlipidemia - statin intolerance, did not tolerate zetia - reluctant to start pcsk9 inhibitor  - TC 281 TG 330 HDL 47 LDL 168 - followed in lipid clinic - has been reluctant for pcsk9 I    - 07/2020 TC 208 HDL 63 LDL 129  - remains resistant toward pcsk48mAb 09/2021 TC 260 TG 258 HDL 51 LDL 161     5. LE edema -05/2019 echo LVEF 65-70%, grade I DD   - swelling has improved off diclofenac.  - wears compresson stockings.  - needs refill on her prn lasix   - no recent edema, has not needed prn lasix.    SH: . Keeps her grandkids 50 and 33 years old, lives next door. Has 4 great grand kids Past Medical History:  Diagnosis Date   Atropine adverse reaction    atropine and sensitivity by history   Chest pain    cardiac catheterization 2003, no major epicardial disease, small distal vessels I   Ejection fraction    65%.Marland Kitchenecho April 2003   GERD (gastroesophageal reflux disease)    Hx of cholecystectomy 03/2006   lap    Hyperlipemia     Hypertension    renal artery ultrasound, January, 2011, no significant renal artery stenosis,  ... medications make her feel tired   Jaw pain    etiology uncertain   Palpitations    Statin intolerance    muscle weakness   Vertigo    Mild positional vertigo     Allergies  Allergen Reactions   Bee Venom Anaphylaxis   Amoxicillin Hives   Atropine     Heart race    Doxycycline Swelling   Band-Aid Plus Antibiotic [Bacitracin-Polymyxin B] Rash   Latex Rash     Current Outpatient Medications  Medication Sig Dispense Refill   acetaminophen (TYLENOL) 500 MG tablet Take 500 mg by mouth every 6 (six) hours as needed. (Patient not taking: Reported on 05/11/2022)     ALPRAZolam (XANAX) 0.5 MG tablet Take 0.5 mg by mouth 2 (two) times daily as needed for anxiety.      amLODipine (NORVASC) 5 MG tablet TAKE (1) TABLET TWICE DAILY. 60 tablet 6   Ascorbic Acid (VITAMIN C) 1000 MG tablet Take 1,000 mg by mouth daily.     calcium carbonate (TUMS - DOSED IN MG ELEMENTAL CALCIUM) 500 MG chewable tablet Chew 1 tablet by mouth daily as needed.  Cholecalciferol (VITAMIN D3) 1000 UNITS CAPS Take 1 capsule by mouth daily.     Coenzyme Q10 (COQ10) 100 MG CAPS Take 1 capsule by mouth daily.       esomeprazole (NEXIUM) 20 MG capsule Take 20 mg by mouth daily.      Flaxseed, Linseed, (FLAXSEED OIL) 1000 MG CAPS Take 1 capsule by mouth daily.     furosemide (LASIX) 40 MG tablet Take 1 tablet (40 mg total) by mouth daily as needed (swelling). 90 tablet 1   magnesium oxide (MAG-OX) 400 (240 Mg) MG tablet Take 400 mg by mouth daily.     Multiple Vitamin (MULTIVITAMIN) tablet Take 1 tablet by mouth daily.       nadolol (CORGARD) 40 MG tablet TAKE (1) TABLET TWICE DAILY. 180 tablet 2   potassium chloride (KLOR-CON) 20 MEQ packet Take 20 mEq by mouth every other day.     potassium chloride SA (KLOR-CON M) 20 MEQ tablet TAKE 1 TABLET ONCE DAILY. 90 tablet 1   pyridOXINE (VITAMIN B-6) 100 MG tablet Take 100 mg  by mouth daily.     zinc gluconate 50 MG tablet Take 50 mg by mouth daily.     No current facility-administered medications for this visit.     Past Surgical History:  Procedure Laterality Date   BREAST BIOPSY     x2 for benign disease   CARDIAC CATHETERIZATION     CHOLECYSTECTOMY     (lap) 2008.   COLONOSCOPY N/A 11/22/2013   Procedure: COLONOSCOPY;  Surgeon: Malissa Hippo, MD;  Location: [facility];  Service: Endoscopy;  Laterality: N/A;  200   rt knee arthroscopy       Allergies  Allergen Reactions   Bee Venom Anaphylaxis   Amoxicillin Hives   Atropine     Heart race    Doxycycline Swelling   Band-Aid Plus Antibiotic [Bacitracin-Polymyxin B] Rash   Latex Rash      Family History  Problem Relation Age of Onset   Breast cancer Sister    Coronary artery disease Other        unknown     Social History Ms. Tiburcio reports that she has never smoked. She has never been exposed to tobacco smoke. She has never used smokeless tobacco. Ms. Lupo reports no history of alcohol use.   Review of Systems CONSTITUTIONAL: No weight loss, fever, chills, weakness or fatigue.  HEENT: Eyes: No visual loss, blurred vision, double vision or yellow sclerae.No hearing loss, sneezing, congestion, runny nose or sore throat.  SKIN: No rash or itching.  CARDIOVASCULAR: per hpi RESPIRATORY: No shortness of breath, cough or sputum.  GASTROINTESTINAL: No anorexia, nausea, vomiting or diarrhea. No abdominal pain or blood.  GENITOURINARY: No burning on urination, no polyuria NEUROLOGICAL: per hpi MUSCULOSKELETAL: No muscle, back pain, joint pain or stiffness.  LYMPHATICS: No enlarged nodes. No history of splenectomy.  PSYCHIATRIC: No history of depression or anxiety.  ENDOCRINOLOGIC: No reports of sweating, cold or heat intolerance. No polyuria or polydipsia.  [name]   Physical Examination Today's Vitals   12/11/22 1419  BP: 136/68  Pulse: 64  SpO2: 98%  Weight: 165 lb  (74.8 kg)  Height: 4\' 11"  (1.499 m)   Body mass index is 33.33 kg/m.  Gen: resting comfortably, no acute distress HEENT: no scleral icterus, pupils equal round and reactive, no palptable cervical adenopathy,  CV: RRR, no m/rg, no jvd Resp: Clear to auscultation bilaterally GI: abdomen is soft, non-tender, non-distended, normal bowel sounds, no  hepatosplenomegaly MSK: extremities are warm, no edema.  Skin: warm, no rash Neuro:  no focal deficits Psych: appropriate affect   Diagnostic Studies  05/2001 cath ANGIOGRAPHIC DATA: 1. Ventriculography was performed in the RAO projection.  Overall left    ventricular function was preserved and no segmental abnormalities,    contraction were identified.  Ejection fraction was calculated at 61%.  I    could not appreciate significant mitral regurgitation.   2. The left main coronary artery appeared free of critical disease.   3. The left anterior descending artery coursed to the apex.  There were four    small diagonal branches.  The LAD as well as the other terminal branches of    the coronary artery all tapered rather quickly and were fairly small in    caliber.   4. There is a small ramus that was free of critical disease.   5. There was a circumflex that provided two marginals that appeared to be free    of significant disease.  Again, the vessels terminated distally as small    vessels.   6. The right coronary artery was a dominant vessel.  There was a small acute    marginal Yana Schorr in terms of caliber.  There was a moderate sized    posterolateral Shaymus Eveleth.  All of these were without obvious focal narrowing.   CONCLUSIONS: 1. Preserved left ventricular function. 2. 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   Feb 25 2015 clinic EKG (performed and reviewed): NSR   Jan 2017 echo Study Conclusions  - Left ventricle: The cavity size was normal. Wall thickness  was at   the upper limits of normal. Systolic function was vigorous. The   estimated ejection fraction was in the range of 60% to 65%. Wall   motion was normal; there were no regional wall motion   abnormalities. Doppler parameters are consistent with abnormal   left ventricular relaxation (grade 1 diastolic dysfunction).   Normal filling pressures. - Mitral valve: There was trivial regurgitation. - Right atrium: Central venous pressure (est): 3 mm Hg. - Tricuspid valve: There was trivial regurgitation. - Pulmonary arteries: PA peak pressure: 27 mm Hg (S). - Pericardium, extracardiac: There was no pericardial effusion.  Impressions:  - Upper normal LV wall thickness with LVEF 60-65%. Grade 1   diastolic dysfunction with normal filling pressures. Trivial   mitral and tricuspid regurgitation. Normal PASP 27 mmHg.    04/2016 Event monitor Telemetry tracings show sinus rhythm with occasional PACs Reported symptoms correlate to NSR with PACs. No significant arrhythmias     05/2019 echo IMPRESSIONS     1. Left ventricular ejection fraction, by estimation, is 65 to 70%. The  left ventricle has hyperdynamic function. The left ventricle has no  regional wall motion abnormalities. Left ventricular diastolic parameters  are consistent with Grade I diastolic  dysfunction (impaired relaxation).   2. Right ventricular systolic function is normal. The right ventricular  size is normal.   3. Left atrial size was mildly dilated.   4. The mitral valve is normal in structure. No evidence of mitral valve  regurgitation. No evidence of mitral stenosis.   5. The aortic valve is tricuspid. Aortic valve regurgitation is not  visualized. No aortic stenosis is present.   6. The inferior vena cava is normal in size with greater than 50%  respiratory variability, suggesting right atrial pressure of 3 mmHg.    Assessment  and Plan   1. Chest pain -  previous cath with minimal disease. - no symptoms,  continue to [facility]   2. Palpitations -some dizziness and palpitations when standing for extended periods of times - reports adequate hydration - orthostastics today - discussed possible repeat monitor, she askes to hold off for now.    3. HTN -typically has white coat HTN.  - bp at goal, continue current meds            Antoine Poche, M.D.

## 2022-12-15 DIAGNOSIS — M419 Scoliosis, unspecified: Secondary | ICD-10-CM | POA: Diagnosis not present

## 2022-12-15 DIAGNOSIS — M47816 Spondylosis without myelopathy or radiculopathy, lumbar region: Secondary | ICD-10-CM | POA: Diagnosis not present

## 2022-12-15 DIAGNOSIS — M4316 Spondylolisthesis, lumbar region: Secondary | ICD-10-CM | POA: Diagnosis not present

## 2022-12-15 DIAGNOSIS — R600 Localized edema: Secondary | ICD-10-CM | POA: Diagnosis not present

## 2022-12-15 DIAGNOSIS — M4807 Spinal stenosis, lumbosacral region: Secondary | ICD-10-CM | POA: Diagnosis not present

## 2022-12-15 DIAGNOSIS — M51369 Other intervertebral disc degeneration, lumbar region without mention of lumbar back pain or lower extremity pain: Secondary | ICD-10-CM | POA: Diagnosis not present

## 2022-12-15 DIAGNOSIS — M48061 Spinal stenosis, lumbar region without neurogenic claudication: Secondary | ICD-10-CM | POA: Diagnosis not present

## 2022-12-17 ENCOUNTER — Other Ambulatory Visit: Payer: Self-pay | Admitting: Cardiology

## 2022-12-23 DIAGNOSIS — M7062 Trochanteric bursitis, left hip: Secondary | ICD-10-CM | POA: Diagnosis not present

## 2022-12-23 DIAGNOSIS — M5137 Other intervertebral disc degeneration, lumbosacral region with discogenic back pain only: Secondary | ICD-10-CM | POA: Diagnosis not present

## 2022-12-23 DIAGNOSIS — M533 Sacrococcygeal disorders, not elsewhere classified: Secondary | ICD-10-CM | POA: Diagnosis not present

## 2022-12-23 DIAGNOSIS — M4727 Other spondylosis with radiculopathy, lumbosacral region: Secondary | ICD-10-CM | POA: Diagnosis not present

## 2023-01-18 ENCOUNTER — Other Ambulatory Visit: Payer: Self-pay | Admitting: Cardiology

## 2023-01-26 DIAGNOSIS — R52 Pain, unspecified: Secondary | ICD-10-CM | POA: Diagnosis not present

## 2023-01-26 DIAGNOSIS — Z299 Encounter for prophylactic measures, unspecified: Secondary | ICD-10-CM | POA: Diagnosis not present

## 2023-01-26 DIAGNOSIS — I10 Essential (primary) hypertension: Secondary | ICD-10-CM | POA: Diagnosis not present

## 2023-01-26 DIAGNOSIS — B029 Zoster without complications: Secondary | ICD-10-CM | POA: Diagnosis not present

## 2023-01-26 DIAGNOSIS — S29012A Strain of muscle and tendon of back wall of thorax, initial encounter: Secondary | ICD-10-CM | POA: Diagnosis not present

## 2023-01-26 DIAGNOSIS — M542 Cervicalgia: Secondary | ICD-10-CM | POA: Diagnosis not present

## 2023-02-01 ENCOUNTER — Emergency Department (HOSPITAL_COMMUNITY)
Admission: EM | Admit: 2023-02-01 | Discharge: 2023-02-01 | Disposition: A | Discharge status: Home / Self Care | Payer: Medicare Other | Attending: Emergency Medicine | Admitting: Emergency Medicine

## 2023-02-01 ENCOUNTER — Encounter (HOSPITAL_COMMUNITY): Payer: Self-pay

## 2023-02-01 ENCOUNTER — Emergency Department (HOSPITAL_COMMUNITY): Payer: Medicare Other

## 2023-02-01 DIAGNOSIS — I10 Essential (primary) hypertension: Secondary | ICD-10-CM | POA: Diagnosis not present

## 2023-02-01 DIAGNOSIS — Z79899 Other long term (current) drug therapy: Secondary | ICD-10-CM | POA: Diagnosis not present

## 2023-02-01 DIAGNOSIS — R079 Chest pain, unspecified: Secondary | ICD-10-CM | POA: Diagnosis not present

## 2023-02-01 DIAGNOSIS — R0989 Other specified symptoms and signs involving the circulatory and respiratory systems: Secondary | ICD-10-CM | POA: Diagnosis not present

## 2023-02-01 DIAGNOSIS — R0789 Other chest pain: Secondary | ICD-10-CM | POA: Insufficient documentation

## 2023-02-01 DIAGNOSIS — M25512 Pain in left shoulder: Secondary | ICD-10-CM | POA: Insufficient documentation

## 2023-02-01 DIAGNOSIS — M546 Pain in thoracic spine: Secondary | ICD-10-CM | POA: Insufficient documentation

## 2023-02-01 DIAGNOSIS — Z91040 Latex allergy status: Secondary | ICD-10-CM | POA: Insufficient documentation

## 2023-02-01 DIAGNOSIS — J9811 Atelectasis: Secondary | ICD-10-CM | POA: Diagnosis not present

## 2023-02-01 DIAGNOSIS — R21 Rash and other nonspecific skin eruption: Secondary | ICD-10-CM | POA: Insufficient documentation

## 2023-02-01 LAB — CBC WITH DIFFERENTIAL/PLATELET
Abs Immature Granulocytes: 0.03 10*3/uL (ref 0.00–0.07)
Basophils Absolute: 0 10*3/uL (ref 0.0–0.1)
Basophils Relative: 1 %
Eosinophils Absolute: 0.2 10*3/uL (ref 0.0–0.5)
Eosinophils Relative: 3 %
HCT: 41.2 % (ref 36.0–46.0)
Hemoglobin: 14.1 g/dL (ref 12.0–15.0)
Immature Granulocytes: 0 %
Lymphocytes Relative: 28 %
Lymphs Abs: 1.9 10*3/uL (ref 0.7–4.0)
MCH: 32.1 pg (ref 26.0–34.0)
MCHC: 34.2 g/dL (ref 30.0–36.0)
MCV: 93.8 fL (ref 80.0–100.0)
Monocytes Absolute: 0.6 10*3/uL (ref 0.1–1.0)
Monocytes Relative: 8 %
Neutro Abs: 4 10*3/uL (ref 1.7–7.7)
Neutrophils Relative %: 60 %
Platelets: 289 10*3/uL (ref 150–400)
RBC: 4.39 MIL/uL (ref 3.87–5.11)
RDW: 13.4 % (ref 11.5–15.5)
WBC: 6.7 10*3/uL (ref 4.0–10.5)
nRBC: 0 % (ref 0.0–0.2)

## 2023-02-01 LAB — BASIC METABOLIC PANEL
Anion gap: 8 (ref 5–15)
BUN: 15 mg/dL (ref 8–23)
CO2: 24 mmol/L (ref 22–32)
Calcium: 9.6 mg/dL (ref 8.9–10.3)
Chloride: 106 mmol/L (ref 98–111)
Creatinine, Ser: 0.86 mg/dL (ref 0.44–1.00)
GFR, Estimated: >60 mL/min (ref >60)
Glucose, Bld: 103 mg/dL — ABNORMAL HIGH (ref 70–99)
Potassium: 3.5 mmol/L (ref 3.5–5.1)
Sodium: 138 mmol/L (ref 135–145)

## 2023-02-01 LAB — TROPONIN I (HIGH SENSITIVITY): Troponin I (High Sensitivity): 7 ng/L (ref <18)

## 2023-02-01 MED ORDER — TRAMADOL HCL 50 MG PO TABS: 50.0000 mg | ORAL_TABLET | Freq: Four times a day (QID) | ORAL | 0 refills | Status: DC | PRN

## 2023-02-01 MED ORDER — TRAMADOL HCL 50 MG PO TABS
50.0000 mg | ORAL_TABLET | Freq: Once | ORAL | Status: AC
  Administered 2023-02-01: 50 mg via ORAL
  Filled 2023-02-01: qty 1

## 2023-02-01 NOTE — ED Provider Notes (Signed)
[place] EMERGENCY DEPARTMENT AT [facility] Provider Note   CSN: [id number] Arrival date & time: 02/01/23  1454     History {Add pertinent medical, surgical, social history, OB history to HPI:1} Chief Complaint  Patient presents with   Rash    Possible Shingles    Lindsay Petty is a 82 y.o. female.   Rash      Lindsay Petty is a 82 y.o. female who presents to the Emergency Department complaining of    Home Medications Prior to Admission medications   Medication Sig Start Date End Date Taking? Authorizing Provider  acetaminophen (TYLENOL) 500 MG tablet Take 500 mg by mouth every 6 (six) hours as needed. Patient not taking: Reported on 05/11/2022    [provider]  ALPRAZolam Prudy Feeler) 0.5 MG tablet Take 0.5 mg by mouth 2 (two) times daily as needed for anxiety.     [provider]  amLODipine (NORVASC) 5 MG tablet take (1) tablet twice daily. 01/18/23   Antoine Poche, MD  Ascorbic Acid (VITAMIN C) 1000 MG tablet Take 1,000 mg by mouth daily.    [provider]  calcium carbonate (TUMS - DOSED IN MG ELEMENTAL CALCIUM) 500 MG chewable tablet Chew 1 tablet by mouth daily as needed.     [provider]  Cholecalciferol (VITAMIN D3) 1000 UNITS CAPS Take 1 capsule by mouth daily.    [provider]  Coenzyme Q10 (COQ10) 100 MG CAPS Take 1 capsule by mouth daily.      [provider]  esomeprazole (NEXIUM) 20 MG capsule Take 20 mg by mouth daily.     [provider]  Flaxseed, Linseed, (FLAXSEED OIL) 1000 MG CAPS Take 1 capsule by mouth daily.    [provider]  fluorometholone (FML) 0.1 % ophthalmic suspension Place 1 drop into both eyes 2 (two) times daily. 10/13/22   [provider]  furosemide (LASIX) 40 MG tablet Take 1 tablet (40 mg total) by mouth daily as needed (swelling). Patient not taking: Reported on 12/11/2022 05/11/22   Antoine Poche, MD  magnesium  oxide (MAG-OX) 400 (240 Mg) MG tablet Take 400 mg by mouth daily.    [provider]  Multiple Vitamin (MULTIVITAMIN) tablet Take 1 tablet by mouth daily.      [provider]  nadolol (CORGARD) 40 MG tablet take (1) tablet twice daily. 12/17/22   Antoine Poche, MD  potassium chloride (KLOR-CON) 20 MEQ packet Take 20 mEq by mouth every other day.    [provider]  pyridOXINE (VITAMIN B-6) 100 MG tablet Take 100 mg by mouth daily.    [provider]  zinc gluconate 50 MG tablet Take 50 mg by mouth daily.    [provider]      Allergies    Bee venom, Amoxicillin, Atropine, Doxycycline, Band-aid plus antibiotic [bacitracin-polymyxin b], and Latex    Review of Systems   Review of Systems  Skin:  Positive for rash.    Physical Exam Updated Vital Signs BP (!) 199/73 (BP Location: Right Arm)   Pulse 69   Temp 98.1 F (36.7 C) (Oral)   Resp 20   Ht 4\' 11"  (1.499 m)   Wt 74.8 kg   SpO2 98%   BMI 33.33 kg/m  Physical Exam  ED Results / Procedures / Treatments   Labs (all labs ordered are listed, but only abnormal results are displayed) Labs Reviewed  BASIC METABOLIC PANEL - Abnormal; Notable  for the following components:      Result Value   Glucose, Bld 103 (*)    All other components within normal limits  CBC WITH DIFFERENTIAL/PLATELET  TROPONIN I (HIGH SENSITIVITY)    EKG EKG Interpretation Date/Time:  Monday February 01 2023 17:29:02 EST Ventricular Rate:  63 PR Interval:  187 QRS Duration:  86 QT Interval:  449 QTC Calculation: 460 R Axis:   27  Text Interpretation: Sinus rhythm Nonspecific repol abnormality, lateral leads Confirmed by Vanetta Mulders [id number]) on 02/01/2023 7:15:10 PM  Radiology DG Chest Portable 1 View Result Date: 02/01/2023 CLINICAL DATA:  Chest pain. EXAM: PORTABLE CHEST 1 VIEW COMPARISON:  None Available. FINDINGS: Low lung volumes. Heart is normal in size for technique. Suspected  retrocardiac hiatal hernia. Linear subsegmental opacity in both lung bases, favor atelectasis. No significant pleural effusion. No pneumothorax. No pulmonary edema. On limited assessment, no acute osseous findings. IMPRESSION: 1. Low lung volumes with bibasilar atelectasis. 2. Suspected retrocardiac hiatal hernia. Electronically Signed   By: Narda Rutherford M.D.   On: 02/01/2023 19:14    Procedures Procedures  {Document cardiac monitor, telemetry assessment procedure when appropriate:1}  Medications Ordered in ED Medications  traMADol (ULTRAM) tablet 50 mg (has no administration in time range)    ED Course/ Medical Decision Making/ A&P   {   Click here for ABCD2, HEART and other calculatorsREFRESH Note before signing :1}                              Medical Decision Making Patient here for concern of left upper chest wall pain left upper back pain symptoms present for 4 days.  Noticed mild red rash to her left upper chest.  Was seen and evaluated for possible shingles.  Has been taking Valtrex 3 times daily for 4 days without relief.  Denies any shortness of breath.  Describes the pain along her shoulder armpit and upper chest area.  No pain radiating down her arm, extremity numbness or weakness.   Symptoms felt to be possible shingles although rash on my exam does not appear consistent with zoster.  ACS also considered.  Will check labs EKG.  Pain addressed here with tramadol as patient states she cannot take "strong painkillers."  No relief with over-the-counter Tylenol  Amount and/or Complexity of Data Reviewed Labs: ordered.  Risk Prescription drug management.     {Document critical care time when appropriate:1} {Document review of labs and clinical decision tools ie heart score, Chads2Vasc2 etc:1}  {Document your independent review of radiology images, and any outside records:1} {Document your discussion with family members, caretakers, and with consultants:1} {Document  social determinants of health affecting pt's care:1} {Document your decision making why or why not admission, treatments were needed:1} Final Clinical Impression(s) / ED Diagnoses Final diagnoses:  None    Rx / DC Orders ED Discharge Orders     None

## 2023-02-01 NOTE — Discharge Instructions (Addendum)
Continue taking your Valtrex as directed.  You have been prescribed tramadol to help with your pain.  Please follow-up with your primary care provider for recheck.  Return to the emergency department for any new or worsening symptoms

## 2023-02-01 NOTE — ED Triage Notes (Addendum)
Pt c/o small rash above L breast and posterior L upper arm since yesterday and L upper back pain x8 days.  Pain score 7/10.  Pt is concerned for shingles.  Sts she has shingles x2 previously.    Pt reports taking valtrex x4 days.   Pt was seen by PCP and treated for pulled muscles.

## 2023-02-04 DIAGNOSIS — I10 Essential (primary) hypertension: Secondary | ICD-10-CM | POA: Diagnosis not present

## 2023-02-04 DIAGNOSIS — N183 Chronic kidney disease, stage 3 unspecified: Secondary | ICD-10-CM | POA: Diagnosis not present

## 2023-02-04 DIAGNOSIS — B029 Zoster without complications: Secondary | ICD-10-CM | POA: Diagnosis not present

## 2023-02-04 DIAGNOSIS — I700 Atherosclerosis of aorta: Secondary | ICD-10-CM | POA: Diagnosis not present

## 2023-02-04 DIAGNOSIS — N1831 Chronic kidney disease, stage 3a: Secondary | ICD-10-CM | POA: Diagnosis not present

## 2023-02-08 DIAGNOSIS — I10 Essential (primary) hypertension: Secondary | ICD-10-CM | POA: Diagnosis not present

## 2023-02-08 DIAGNOSIS — M542 Cervicalgia: Secondary | ICD-10-CM | POA: Diagnosis not present

## 2023-02-08 DIAGNOSIS — B0229 Other postherpetic nervous system involvement: Secondary | ICD-10-CM | POA: Diagnosis not present

## 2023-02-08 DIAGNOSIS — Z299 Encounter for prophylactic measures, unspecified: Secondary | ICD-10-CM | POA: Diagnosis not present

## 2023-02-08 DIAGNOSIS — B029 Zoster without complications: Secondary | ICD-10-CM | POA: Diagnosis not present

## 2023-03-11 DIAGNOSIS — Z299 Encounter for prophylactic measures, unspecified: Secondary | ICD-10-CM | POA: Diagnosis not present

## 2023-03-11 DIAGNOSIS — R7301 Impaired fasting glucose: Secondary | ICD-10-CM | POA: Diagnosis not present

## 2023-03-11 DIAGNOSIS — B029 Zoster without complications: Secondary | ICD-10-CM | POA: Diagnosis not present

## 2023-03-11 DIAGNOSIS — I10 Essential (primary) hypertension: Secondary | ICD-10-CM | POA: Diagnosis not present

## 2023-03-25 DIAGNOSIS — Z299 Encounter for prophylactic measures, unspecified: Secondary | ICD-10-CM | POA: Diagnosis not present

## 2023-03-25 DIAGNOSIS — L739 Follicular disorder, unspecified: Secondary | ICD-10-CM | POA: Diagnosis not present

## 2023-03-25 DIAGNOSIS — I10 Essential (primary) hypertension: Secondary | ICD-10-CM | POA: Diagnosis not present

## 2023-03-25 DIAGNOSIS — B029 Zoster without complications: Secondary | ICD-10-CM | POA: Diagnosis not present

## 2023-07-22 ENCOUNTER — Other Ambulatory Visit: Payer: Self-pay | Admitting: Cardiology

## 2023-07-23 MED ORDER — POTASSIUM CHLORIDE 20 MEQ PO PACK: 20.0000 meq | PACK | ORAL | 1 refills | Status: AC

## 2023-09-15 DIAGNOSIS — M7062 Trochanteric bursitis, left hip: Secondary | ICD-10-CM | POA: Diagnosis not present

## 2023-09-21 ENCOUNTER — Other Ambulatory Visit: Payer: Self-pay | Admitting: Cardiology

## 2023-10-04 ENCOUNTER — Encounter: Payer: Self-pay | Admitting: Cardiology

## 2023-10-04 ENCOUNTER — Ambulatory Visit: Attending: Cardiology | Admitting: Cardiology

## 2023-10-04 VITALS — BP 170/75 | HR 68 | Ht 59.0 in | Wt 161.0 lb

## 2023-10-04 DIAGNOSIS — R0789 Other chest pain: Secondary | ICD-10-CM | POA: Insufficient documentation

## 2023-10-04 DIAGNOSIS — R002 Palpitations: Secondary | ICD-10-CM | POA: Insufficient documentation

## 2023-10-04 DIAGNOSIS — I10 Essential (primary) hypertension: Secondary | ICD-10-CM | POA: Insufficient documentation

## 2023-10-04 NOTE — Progress Notes (Signed)
 Clinical Summary Ms. Ireland is a 83 y.o.female seen today for follow up of the following medical problems.   1. History of chest pain/CAD - history of prior chest pain symptoms. Cath in 2003 without significant disease, she had small distal vessel disease - she stopped ASA due to bruising.    - no recent chest pains.      2. Palpitations  - 04/2016 event monitor showed symptoms correlated with SR and PACs.Taking nadolol  40mg  bid   - rare short palpitations.    3. HTN -previously had increased edema on higher norvasc  dose, improved on lower dose however back on norvasc  10 without recurrent issues.   - Has had some issues with low K on chlorthalidone , some uptrend in Cr. It was stopped previously.  -  cough previously on lisinopril in the past - compliant with meds     4. Hyperlipidemia - statin intolerance, did not tolerate zetia  - reluctant to start pcsk9 inhibitor  - TC 281 TG 330 HDL 47 LDL 168 - followed in lipid clinic - has been reluctant for pcsk9 I    - 07/2020 TC 208 HDL 63 LDL 129  - remains resistant toward pcsk23mAb 09/2021 TC 260 TG 258 HDL 51 LDL 161 - 05/2022 TC 244 TG 205 HDL 54 LDL 149     5. LE edema -05/2019 echo LVEF 65-70%, grade I DD   - no edema, has prn lasix  but has not needed   SH: SABRA Keeps her grandkids 54 and 62 years old, lives next door. Has 4 great grand kids  Past Medical History:  Diagnosis Date   Atropine adverse reaction    atropine and sensitivity by history   Chest pain    cardiac catheterization 2003, no major epicardial disease, small distal vessels I   Ejection fraction    65%.SABRAecho April 2003   GERD (gastroesophageal reflux disease)    Hx of cholecystectomy 03/2006   lap    Hyperlipemia    Hypertension    renal artery ultrasound, January, 2011, no significant renal artery stenosis,  ... medications make her feel tired   Jaw pain    etiology uncertain   Palpitations    Statin intolerance    muscle weakness    Vertigo    Mild positional vertigo     Allergies  Allergen Reactions   Bee Venom Anaphylaxis   Amoxicillin Hives   Atropine     Heart race    Doxycycline Swelling   Band-Aid Plus Antibiotic [Bacitracin-Polymyxin B] Rash   Latex Rash     Current Outpatient Medications  Medication Sig Dispense Refill   ALPRAZolam  (XANAX ) 0.5 MG tablet Take 0.5 mg by mouth 2 (two) times daily as needed for anxiety.      amLODipine  (NORVASC ) 5 MG tablet take (1) tablet twice daily. 180 tablet 0   Ascorbic Acid  (VITAMIN C) 1000 MG tablet Take 1,000 mg by mouth daily.     calcium  carbonate (TUMS - DOSED IN MG ELEMENTAL CALCIUM ) 500 MG chewable tablet Chew 1 tablet by mouth daily as needed.      Cholecalciferol  (VITAMIN D3) 1000 UNITS CAPS Take 1 capsule by mouth daily.     Coenzyme Q10 (COQ10) 100 MG CAPS Take 1 capsule by mouth daily.       esomeprazole (NEXIUM) 20 MG capsule Take 20 mg by mouth daily.      Flaxseed, Linseed, (FLAXSEED OIL) 1000 MG CAPS Take 1 capsule by mouth daily.  furosemide  (LASIX ) 40 MG tablet Take 1 tablet (40 mg total) by mouth daily as needed (swelling). 90 tablet 1   magnesium  oxide (MAG-OX) 400 (240 Mg) MG tablet Take 400 mg by mouth daily.     Multiple Vitamin (MULTIVITAMIN) tablet Take 1 tablet by mouth daily.       nadolol  (CORGARD ) 40 MG tablet take (1) tablet twice daily. 180 tablet 2   potassium chloride  (KLOR-CON ) 20 MEQ packet Take 20 mEq by mouth every other day. 45 packet 1   pyridOXINE  (VITAMIN B-6) 100 MG tablet Take 100 mg by mouth daily.     No current facility-administered medications for this visit.     Past Surgical History:  Procedure Laterality Date   BREAST BIOPSY     x2 for benign disease   CARDIAC CATHETERIZATION     CHOLECYSTECTOMY     (lap) 2008.   COLONOSCOPY N/A 11/22/2013   Procedure: COLONOSCOPY;  Surgeon: Claudis RAYMOND Rivet, MD;  Location: [facility];  Service: Endoscopy;  Laterality: N/A;  200   rt knee arthroscopy        Allergies  Allergen Reactions   Bee Venom Anaphylaxis   Amoxicillin Hives   Atropine     Heart race    Doxycycline Swelling   Band-Aid Plus Antibiotic [Bacitracin-Polymyxin B] Rash   Latex Rash      Family History  Problem Relation Age of Onset   Breast cancer Sister    Coronary artery disease Other        unknown     Social History Ms. Polimeni reports that she has never smoked. She has never been exposed to tobacco smoke. She has never used smokeless tobacco. Ms. Rolison reports no history of alcohol  use.      Physical Examination Vitals:   10/04/23 1332 10/04/23 1406  BP: (!) 170/84 (!) 170/75  Pulse: 68   SpO2: 99%    Filed Weights   10/04/23 1332  Weight: 161 lb (73 kg)    Gen: resting comfortably, no acute distress HEENT: no scleral icterus, pupils equal round and reactive, no palptable cervical adenopathy,  CV: RRR, no mrg, no jvd Resp: Clear to auscultation bilaterally GI: abdomen is soft, non-tender, non-distended, normal bowel sounds, no hepatosplenomegaly MSK: extremities are warm, no edema.  Skin: warm, no rash Neuro:  no focal deficits Psych: appropriate affect   Diagnostic Studies  05/2001 cath ANGIOGRAPHIC DATA: 1. Ventriculography was performed in the RAO projection.  Overall left    ventricular function was preserved and no segmental abnormalities,    contraction were identified.  Ejection fraction was calculated at 61%.  I    could not appreciate significant mitral regurgitation.   2. The left main coronary artery appeared free of critical disease.   3. The left anterior descending artery coursed to the apex.  There were four    small diagonal branches.  The LAD as well as the other terminal branches of    the coronary artery all tapered rather quickly and were fairly small in    caliber.   4. There is a small ramus that was free of critical disease.   5. There was a circumflex that provided two marginals that  appeared to be free    of significant disease.  Again, the vessels terminated distally as small    vessels.   6. The right coronary artery was a dominant vessel.  There was a small acute    marginal Cherre Kothari in terms of caliber.  There was a moderate sized    posterolateral Albion Weatherholtz.  All of these were without obvious focal narrowing.   CONCLUSIONS: 1. Preserved left ventricular function. 2. 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   Feb 25 2015 clinic EKG (performed and reviewed): NSR   Jan 2017 echo Study Conclusions  - Left ventricle: The cavity size was normal. Wall thickness was at   the upper limits of normal. Systolic function was vigorous. The   estimated ejection fraction was in the range of 60% to 65%. Wall   motion was normal; there were no regional wall motion   abnormalities. Doppler parameters are consistent with abnormal   left ventricular relaxation (grade 1 diastolic dysfunction).   Normal filling pressures. - Mitral valve: There was trivial regurgitation. - Right atrium: Central venous pressure (est): 3 mm Hg. - Tricuspid valve: There was trivial regurgitation. - Pulmonary arteries: PA peak pressure: 27 mm Hg (S). - Pericardium, extracardiac: There was no pericardial effusion.  Impressions:  - Upper normal LV wall thickness with LVEF 60-65%. Grade 1   diastolic dysfunction with normal filling pressures. Trivial   mitral and tricuspid regurgitation. Normal PASP 27 mmHg.    04/2016 Event monitor Telemetry tracings show sinus rhythm with occasional PACs Reported symptoms correlate to NSR with PACs. No significant arrhythmias     05/2019 echo IMPRESSIONS     1. Left ventricular ejection fraction, by estimation, is 65 to 70%. The  left ventricle has hyperdynamic function. The left ventricle has no  regional wall motion abnormalities. Left ventricular diastolic parameters  are consistent with  Grade I diastolic  dysfunction (impaired relaxation).   2. Right ventricular systolic function is normal. The right ventricular  size is normal.   3. Left atrial size was mildly dilated.   4. The mitral valve is normal in structure. No evidence of mitral valve  regurgitation. No evidence of mitral stenosis.   5. The aortic valve is tricuspid. Aortic valve regurgitation is not  visualized. No aortic stenosis is present.   6. The inferior vena cava is normal in size with greater than 50%  respiratory variability, suggesting right atrial pressure of 3 mmHg.    Assessment and Plan   1. Chest pain -  previous cath with minimal disease. - no recent symptoms, continue to monitor   2. Palpitations - no significant symptoms, continue to monitor  3. HTN -typically has white coat HTN.  - bp elevated in clinic, she will monitor at home and update us  1 week - if elevated likely would start losartan.      Dorn PHEBE Ross, M.D.

## 2023-10-04 NOTE — Patient Instructions (Addendum)
 Medication Instructions:   Continue all current medications.   Labwork:  none  Testing/Procedures:  none  Follow-Up:  6 months   Any Other Special Instructions Will Be Listed Below (If Applicable).  Please keep BP log x 1 weeks & return to office for provider review.    If you need a refill on your cardiac medications before your next appointment, please call your pharmacy.

## 2023-10-13 DIAGNOSIS — M2392 Unspecified internal derangement of left knee: Secondary | ICD-10-CM | POA: Diagnosis not present

## 2023-10-13 DIAGNOSIS — M25562 Pain in left knee: Secondary | ICD-10-CM | POA: Diagnosis not present

## 2023-10-13 DIAGNOSIS — S8992XA Unspecified injury of left lower leg, initial encounter: Secondary | ICD-10-CM | POA: Diagnosis not present

## 2023-10-13 DIAGNOSIS — M25462 Effusion, left knee: Secondary | ICD-10-CM | POA: Diagnosis not present

## 2023-10-19 DIAGNOSIS — M25462 Effusion, left knee: Secondary | ICD-10-CM | POA: Diagnosis not present

## 2023-10-19 DIAGNOSIS — M2392 Unspecified internal derangement of left knee: Secondary | ICD-10-CM | POA: Diagnosis not present

## 2023-10-19 DIAGNOSIS — S8992XD Unspecified injury of left lower leg, subsequent encounter: Secondary | ICD-10-CM | POA: Diagnosis not present

## 2023-10-22 ENCOUNTER — Other Ambulatory Visit: Payer: Self-pay | Admitting: Cardiology

## 2023-10-28 DIAGNOSIS — Z6832 Body mass index (BMI) 32.0-32.9, adult: Secondary | ICD-10-CM | POA: Diagnosis not present

## 2023-10-28 DIAGNOSIS — Z1331 Encounter for screening for depression: Secondary | ICD-10-CM | POA: Diagnosis not present

## 2023-10-28 DIAGNOSIS — Z79899 Other long term (current) drug therapy: Secondary | ICD-10-CM | POA: Diagnosis not present

## 2023-10-28 DIAGNOSIS — Z0000 Encounter for general adult medical examination without abnormal findings: Secondary | ICD-10-CM | POA: Diagnosis not present

## 2023-10-28 DIAGNOSIS — I10 Essential (primary) hypertension: Secondary | ICD-10-CM | POA: Diagnosis not present

## 2023-10-28 DIAGNOSIS — Z1339 Encounter for screening examination for other mental health and behavioral disorders: Secondary | ICD-10-CM | POA: Diagnosis not present

## 2023-10-28 DIAGNOSIS — R5383 Other fatigue: Secondary | ICD-10-CM | POA: Diagnosis not present

## 2023-10-28 DIAGNOSIS — E7800 Pure hypercholesterolemia, unspecified: Secondary | ICD-10-CM | POA: Diagnosis not present

## 2023-10-28 DIAGNOSIS — Z299 Encounter for prophylactic measures, unspecified: Secondary | ICD-10-CM | POA: Diagnosis not present

## 2023-10-28 DIAGNOSIS — Z7189 Other specified counseling: Secondary | ICD-10-CM | POA: Diagnosis not present

## 2023-10-28 DIAGNOSIS — R52 Pain, unspecified: Secondary | ICD-10-CM | POA: Diagnosis not present

## 2023-10-28 DIAGNOSIS — M8580 Other specified disorders of bone density and structure, unspecified site: Secondary | ICD-10-CM | POA: Diagnosis not present

## 2023-10-28 DIAGNOSIS — F419 Anxiety disorder, unspecified: Secondary | ICD-10-CM | POA: Diagnosis not present

## 2023-11-04 DIAGNOSIS — M79672 Pain in left foot: Secondary | ICD-10-CM | POA: Diagnosis not present

## 2023-11-04 DIAGNOSIS — M24173 Other articular cartilage disorders, unspecified ankle: Secondary | ICD-10-CM | POA: Diagnosis not present

## 2023-11-04 DIAGNOSIS — M7989 Other specified soft tissue disorders: Secondary | ICD-10-CM | POA: Diagnosis not present

## 2023-11-04 DIAGNOSIS — M24176 Other articular cartilage disorders, unspecified foot: Secondary | ICD-10-CM | POA: Diagnosis not present

## 2023-11-16 DIAGNOSIS — M23304 Other meniscus derangements, unspecified medial meniscus, left knee: Secondary | ICD-10-CM | POA: Diagnosis not present

## 2023-11-16 DIAGNOSIS — S83282A Other tear of lateral meniscus, current injury, left knee, initial encounter: Secondary | ICD-10-CM | POA: Diagnosis not present

## 2023-11-16 DIAGNOSIS — M7122 Synovial cyst of popliteal space [Baker], left knee: Secondary | ICD-10-CM | POA: Diagnosis not present

## 2023-11-16 DIAGNOSIS — M2242 Chondromalacia patellae, left knee: Secondary | ICD-10-CM | POA: Diagnosis not present

## 2023-11-16 DIAGNOSIS — M25462 Effusion, left knee: Secondary | ICD-10-CM | POA: Diagnosis not present

## 2023-11-16 DIAGNOSIS — M2352 Chronic instability of knee, left knee: Secondary | ICD-10-CM | POA: Diagnosis not present

## 2023-11-22 DIAGNOSIS — M7062 Trochanteric bursitis, left hip: Secondary | ICD-10-CM | POA: Diagnosis not present

## 2023-11-22 DIAGNOSIS — X58XXXD Exposure to other specified factors, subsequent encounter: Secondary | ICD-10-CM | POA: Diagnosis not present

## 2023-11-22 DIAGNOSIS — M5137 Other intervertebral disc degeneration, lumbosacral region with discogenic back pain only: Secondary | ICD-10-CM | POA: Diagnosis not present

## 2023-11-22 DIAGNOSIS — M4727 Other spondylosis with radiculopathy, lumbosacral region: Secondary | ICD-10-CM | POA: Diagnosis not present

## 2023-11-22 DIAGNOSIS — M2392 Unspecified internal derangement of left knee: Secondary | ICD-10-CM | POA: Diagnosis not present

## 2023-11-22 DIAGNOSIS — S8992XD Unspecified injury of left lower leg, subsequent encounter: Secondary | ICD-10-CM | POA: Diagnosis not present

## 2023-11-22 DIAGNOSIS — M79672 Pain in left foot: Secondary | ICD-10-CM | POA: Diagnosis not present

## 2023-12-16 DIAGNOSIS — H35361 Drusen (degenerative) of macula, right eye: Secondary | ICD-10-CM | POA: Diagnosis not present

## 2023-12-20 DIAGNOSIS — M7062 Trochanteric bursitis, left hip: Secondary | ICD-10-CM | POA: Diagnosis not present

## 2023-12-20 DIAGNOSIS — R2681 Unsteadiness on feet: Secondary | ICD-10-CM | POA: Diagnosis not present

## 2023-12-20 DIAGNOSIS — R29898 Other symptoms and signs involving the musculoskeletal system: Secondary | ICD-10-CM | POA: Diagnosis not present

## 2023-12-20 DIAGNOSIS — M2352 Chronic instability of knee, left knee: Secondary | ICD-10-CM | POA: Diagnosis not present

## 2023-12-20 DIAGNOSIS — S8992XD Unspecified injury of left lower leg, subsequent encounter: Secondary | ICD-10-CM | POA: Diagnosis not present

## 2023-12-20 DIAGNOSIS — M4727 Other spondylosis with radiculopathy, lumbosacral region: Secondary | ICD-10-CM | POA: Diagnosis not present

## 2023-12-20 DIAGNOSIS — X58XXXD Exposure to other specified factors, subsequent encounter: Secondary | ICD-10-CM | POA: Diagnosis not present

## 2024-01-03 DIAGNOSIS — S8992XD Unspecified injury of left lower leg, subsequent encounter: Secondary | ICD-10-CM | POA: Diagnosis not present

## 2024-01-03 DIAGNOSIS — M7062 Trochanteric bursitis, left hip: Secondary | ICD-10-CM | POA: Diagnosis not present

## 2024-01-03 DIAGNOSIS — M2352 Chronic instability of knee, left knee: Secondary | ICD-10-CM | POA: Diagnosis not present

## 2024-01-03 DIAGNOSIS — M4727 Other spondylosis with radiculopathy, lumbosacral region: Secondary | ICD-10-CM | POA: Diagnosis not present

## 2024-01-03 DIAGNOSIS — R29898 Other symptoms and signs involving the musculoskeletal system: Secondary | ICD-10-CM | POA: Diagnosis not present

## 2024-01-28 DIAGNOSIS — Z299 Encounter for prophylactic measures, unspecified: Secondary | ICD-10-CM | POA: Diagnosis not present

## 2024-01-28 DIAGNOSIS — F419 Anxiety disorder, unspecified: Secondary | ICD-10-CM | POA: Diagnosis not present

## 2024-01-28 DIAGNOSIS — R002 Palpitations: Secondary | ICD-10-CM | POA: Diagnosis not present

## 2024-01-28 DIAGNOSIS — N1831 Chronic kidney disease, stage 3a: Secondary | ICD-10-CM | POA: Diagnosis not present

## 2024-01-28 DIAGNOSIS — I10 Essential (primary) hypertension: Secondary | ICD-10-CM | POA: Diagnosis not present

## 2024-05-25 ENCOUNTER — Ambulatory Visit: Admitting: Cardiology
